# Patient Record
Sex: Female | Born: 1935 | Race: White | Hispanic: No | Marital: Married | Smoking: Never smoker
Health system: Southern US, Community
[De-identification: ages and names within clinical notes are randomized; demographics above are authoritative.]

## PROBLEM LIST (undated history)

## (undated) ENCOUNTER — Ambulatory Visit: Payer: Medicare Other

## (undated) DIAGNOSIS — B019 Varicella without complication: Secondary | ICD-10-CM

## (undated) DIAGNOSIS — R55 Syncope and collapse: Secondary | ICD-10-CM

## (undated) DIAGNOSIS — G459 Transient cerebral ischemic attack, unspecified: Secondary | ICD-10-CM

## (undated) DIAGNOSIS — S92909A Unspecified fracture of unspecified foot, initial encounter for closed fracture: Secondary | ICD-10-CM

## (undated) DIAGNOSIS — F0280 Dementia in other diseases classified elsewhere without behavioral disturbance: Secondary | ICD-10-CM

## (undated) DIAGNOSIS — N390 Urinary tract infection, site not specified: Secondary | ICD-10-CM

## (undated) HISTORY — DX: Syncope and collapse: R55

## (undated) HISTORY — DX: Urinary tract infection, site not specified: N39.0

## (undated) HISTORY — PX: APPENDECTOMY: SHX54

## (undated) HISTORY — DX: Varicella without complication: B01.9

## (undated) HISTORY — DX: Unspecified fracture of unspecified foot, initial encounter for closed fracture: S92.909A

## (undated) HISTORY — PX: ABDOMINAL HYSTERECTOMY: SHX81

## (undated) HISTORY — DX: Transient cerebral ischemic attack, unspecified: G45.9

---

## 2008-12-24 HISTORY — PX: PACEMAKER INSERTION: SHX728

## 2015-08-13 DIAGNOSIS — S92909A Unspecified fracture of unspecified foot, initial encounter for closed fracture: Secondary | ICD-10-CM

## 2015-08-13 HISTORY — DX: Unspecified fracture of unspecified foot, initial encounter for closed fracture: S92.909A

## 2016-05-13 ENCOUNTER — Encounter: Payer: Self-pay | Admitting: Family Medicine

## 2016-05-17 ENCOUNTER — Encounter: Payer: Self-pay | Admitting: Family Medicine

## 2016-08-15 DIAGNOSIS — L570 Actinic keratosis: Secondary | ICD-10-CM | POA: Diagnosis not present

## 2016-11-09 DIAGNOSIS — Z950 Presence of cardiac pacemaker: Secondary | ICD-10-CM | POA: Diagnosis not present

## 2016-11-14 DIAGNOSIS — T82519A Breakdown (mechanical) of unspecified cardiac and vascular devices and implants, initial encounter: Secondary | ICD-10-CM | POA: Diagnosis not present

## 2016-11-14 DIAGNOSIS — Z950 Presence of cardiac pacemaker: Secondary | ICD-10-CM | POA: Diagnosis not present

## 2016-11-14 DIAGNOSIS — I495 Sick sinus syndrome: Secondary | ICD-10-CM | POA: Diagnosis not present

## 2016-11-14 DIAGNOSIS — Z45010 Encounter for checking and testing of cardiac pacemaker pulse generator [battery]: Secondary | ICD-10-CM | POA: Diagnosis not present

## 2016-11-14 DIAGNOSIS — T82111A Breakdown (mechanical) of cardiac pulse generator (battery), initial encounter: Secondary | ICD-10-CM | POA: Diagnosis not present

## 2016-11-15 DIAGNOSIS — R9431 Abnormal electrocardiogram [ECG] [EKG]: Secondary | ICD-10-CM | POA: Diagnosis not present

## 2016-11-15 DIAGNOSIS — Z950 Presence of cardiac pacemaker: Secondary | ICD-10-CM | POA: Diagnosis not present

## 2016-11-25 DIAGNOSIS — I340 Nonrheumatic mitral (valve) insufficiency: Secondary | ICD-10-CM | POA: Diagnosis not present

## 2016-11-25 DIAGNOSIS — Z45010 Encounter for checking and testing of cardiac pacemaker pulse generator [battery]: Secondary | ICD-10-CM | POA: Diagnosis not present

## 2016-11-25 DIAGNOSIS — I361 Nonrheumatic tricuspid (valve) insufficiency: Secondary | ICD-10-CM | POA: Diagnosis not present

## 2016-11-25 DIAGNOSIS — E785 Hyperlipidemia, unspecified: Secondary | ICD-10-CM | POA: Diagnosis not present

## 2016-11-25 DIAGNOSIS — T82111A Breakdown (mechanical) of cardiac pulse generator (battery), initial encounter: Secondary | ICD-10-CM | POA: Diagnosis not present

## 2016-11-25 DIAGNOSIS — I495 Sick sinus syndrome: Secondary | ICD-10-CM | POA: Diagnosis not present

## 2016-11-25 DIAGNOSIS — Z7901 Long term (current) use of anticoagulants: Secondary | ICD-10-CM | POA: Diagnosis not present

## 2016-11-25 DIAGNOSIS — I10 Essential (primary) hypertension: Secondary | ICD-10-CM | POA: Diagnosis not present

## 2016-11-25 DIAGNOSIS — Z8673 Personal history of transient ischemic attack (TIA), and cerebral infarction without residual deficits: Secondary | ICD-10-CM | POA: Diagnosis not present

## 2016-11-25 HISTORY — PX: PACEMAKER GENERATOR CHANGE: SHX5998

## 2016-12-31 DIAGNOSIS — Z1283 Encounter for screening for malignant neoplasm of skin: Secondary | ICD-10-CM | POA: Diagnosis not present

## 2016-12-31 DIAGNOSIS — Z4802 Encounter for removal of sutures: Secondary | ICD-10-CM | POA: Diagnosis not present

## 2016-12-31 DIAGNOSIS — L814 Other melanin hyperpigmentation: Secondary | ICD-10-CM | POA: Diagnosis not present

## 2016-12-31 DIAGNOSIS — L821 Other seborrheic keratosis: Secondary | ICD-10-CM | POA: Diagnosis not present

## 2016-12-31 DIAGNOSIS — L820 Inflamed seborrheic keratosis: Secondary | ICD-10-CM | POA: Diagnosis not present

## 2016-12-31 DIAGNOSIS — D225 Melanocytic nevi of trunk: Secondary | ICD-10-CM | POA: Diagnosis not present

## 2016-12-31 DIAGNOSIS — Z872 Personal history of diseases of the skin and subcutaneous tissue: Secondary | ICD-10-CM | POA: Diagnosis not present

## 2017-01-16 DIAGNOSIS — H8110 Benign paroxysmal vertigo, unspecified ear: Secondary | ICD-10-CM | POA: Diagnosis not present

## 2017-01-16 DIAGNOSIS — Z6822 Body mass index (BMI) 22.0-22.9, adult: Secondary | ICD-10-CM | POA: Diagnosis not present

## 2017-01-21 DIAGNOSIS — H8110 Benign paroxysmal vertigo, unspecified ear: Secondary | ICD-10-CM | POA: Diagnosis not present

## 2017-05-10 DIAGNOSIS — Z950 Presence of cardiac pacemaker: Secondary | ICD-10-CM | POA: Diagnosis not present

## 2017-05-21 DIAGNOSIS — I5041 Acute combined systolic (congestive) and diastolic (congestive) heart failure: Secondary | ICD-10-CM | POA: Diagnosis not present

## 2017-05-21 DIAGNOSIS — I059 Rheumatic mitral valve disease, unspecified: Secondary | ICD-10-CM | POA: Diagnosis not present

## 2017-05-21 DIAGNOSIS — I351 Nonrheumatic aortic (valve) insufficiency: Secondary | ICD-10-CM | POA: Diagnosis not present

## 2017-05-21 DIAGNOSIS — I369 Nonrheumatic tricuspid valve disorder, unspecified: Secondary | ICD-10-CM | POA: Diagnosis not present

## 2017-05-21 DIAGNOSIS — Q211 Atrial septal defect: Secondary | ICD-10-CM | POA: Diagnosis not present

## 2017-05-21 DIAGNOSIS — R079 Chest pain, unspecified: Secondary | ICD-10-CM | POA: Diagnosis not present

## 2017-05-21 DIAGNOSIS — I10 Essential (primary) hypertension: Secondary | ICD-10-CM | POA: Diagnosis not present

## 2017-05-21 DIAGNOSIS — Z950 Presence of cardiac pacemaker: Secondary | ICD-10-CM | POA: Diagnosis not present

## 2017-05-21 DIAGNOSIS — Z8673 Personal history of transient ischemic attack (TIA), and cerebral infarction without residual deficits: Secondary | ICD-10-CM | POA: Diagnosis not present

## 2017-05-21 DIAGNOSIS — I4589 Other specified conduction disorders: Secondary | ICD-10-CM | POA: Diagnosis not present

## 2017-05-21 DIAGNOSIS — E782 Mixed hyperlipidemia: Secondary | ICD-10-CM | POA: Diagnosis not present

## 2017-06-06 DIAGNOSIS — I10 Essential (primary) hypertension: Secondary | ICD-10-CM | POA: Diagnosis not present

## 2017-06-06 DIAGNOSIS — Z8673 Personal history of transient ischemic attack (TIA), and cerebral infarction without residual deficits: Secondary | ICD-10-CM | POA: Diagnosis not present

## 2017-06-06 DIAGNOSIS — E785 Hyperlipidemia, unspecified: Secondary | ICD-10-CM | POA: Diagnosis not present

## 2017-06-11 DIAGNOSIS — M899 Disorder of bone, unspecified: Secondary | ICD-10-CM | POA: Diagnosis not present

## 2017-06-11 DIAGNOSIS — Z0000 Encounter for general adult medical examination without abnormal findings: Secondary | ICD-10-CM | POA: Diagnosis not present

## 2017-06-11 DIAGNOSIS — I10 Essential (primary) hypertension: Secondary | ICD-10-CM | POA: Diagnosis not present

## 2017-06-11 DIAGNOSIS — Z23 Encounter for immunization: Secondary | ICD-10-CM | POA: Diagnosis not present

## 2017-06-11 DIAGNOSIS — R7303 Prediabetes: Secondary | ICD-10-CM | POA: Diagnosis not present

## 2017-06-11 DIAGNOSIS — I679 Cerebrovascular disease, unspecified: Secondary | ICD-10-CM | POA: Diagnosis not present

## 2017-06-11 DIAGNOSIS — Z6821 Body mass index (BMI) 21.0-21.9, adult: Secondary | ICD-10-CM | POA: Diagnosis not present

## 2017-06-11 DIAGNOSIS — R7301 Impaired fasting glucose: Secondary | ICD-10-CM | POA: Diagnosis not present

## 2017-06-11 DIAGNOSIS — M949 Disorder of cartilage, unspecified: Secondary | ICD-10-CM | POA: Diagnosis not present

## 2017-06-11 DIAGNOSIS — E785 Hyperlipidemia, unspecified: Secondary | ICD-10-CM | POA: Diagnosis not present

## 2017-08-11 DIAGNOSIS — Z950 Presence of cardiac pacemaker: Secondary | ICD-10-CM | POA: Diagnosis not present

## 2017-08-29 ENCOUNTER — Encounter: Payer: Self-pay | Admitting: Family Medicine

## 2017-08-29 DIAGNOSIS — Z1231 Encounter for screening mammogram for malignant neoplasm of breast: Secondary | ICD-10-CM | POA: Diagnosis not present

## 2017-10-21 DIAGNOSIS — B372 Candidiasis of skin and nail: Secondary | ICD-10-CM | POA: Diagnosis not present

## 2017-11-06 ENCOUNTER — Encounter: Payer: Self-pay | Admitting: Family Medicine

## 2017-11-06 DIAGNOSIS — R922 Inconclusive mammogram: Secondary | ICD-10-CM | POA: Diagnosis not present

## 2017-11-06 DIAGNOSIS — R928 Other abnormal and inconclusive findings on diagnostic imaging of breast: Secondary | ICD-10-CM | POA: Diagnosis not present

## 2017-11-10 DIAGNOSIS — Z950 Presence of cardiac pacemaker: Secondary | ICD-10-CM | POA: Diagnosis not present

## 2017-11-24 ENCOUNTER — Encounter: Payer: Self-pay | Admitting: Family Medicine

## 2017-11-24 DIAGNOSIS — I429 Cardiomyopathy, unspecified: Secondary | ICD-10-CM | POA: Diagnosis not present

## 2017-11-24 DIAGNOSIS — I472 Ventricular tachycardia: Secondary | ICD-10-CM | POA: Diagnosis not present

## 2017-11-24 DIAGNOSIS — Z950 Presence of cardiac pacemaker: Secondary | ICD-10-CM | POA: Diagnosis not present

## 2017-11-24 DIAGNOSIS — R55 Syncope and collapse: Secondary | ICD-10-CM | POA: Diagnosis not present

## 2017-11-24 DIAGNOSIS — I471 Supraventricular tachycardia: Secondary | ICD-10-CM | POA: Diagnosis not present

## 2017-11-24 DIAGNOSIS — I495 Sick sinus syndrome: Secondary | ICD-10-CM | POA: Diagnosis not present

## 2017-11-24 DIAGNOSIS — I340 Nonrheumatic mitral (valve) insufficiency: Secondary | ICD-10-CM | POA: Diagnosis not present

## 2017-11-24 DIAGNOSIS — I351 Nonrheumatic aortic (valve) insufficiency: Secondary | ICD-10-CM | POA: Diagnosis not present

## 2017-11-24 DIAGNOSIS — I10 Essential (primary) hypertension: Secondary | ICD-10-CM | POA: Diagnosis not present

## 2017-12-01 ENCOUNTER — Encounter: Payer: Self-pay | Admitting: Family Medicine

## 2017-12-01 DIAGNOSIS — I471 Supraventricular tachycardia: Secondary | ICD-10-CM | POA: Diagnosis not present

## 2017-12-01 DIAGNOSIS — R55 Syncope and collapse: Secondary | ICD-10-CM | POA: Diagnosis not present

## 2017-12-01 DIAGNOSIS — I472 Ventricular tachycardia: Secondary | ICD-10-CM | POA: Diagnosis not present

## 2017-12-01 DIAGNOSIS — I495 Sick sinus syndrome: Secondary | ICD-10-CM | POA: Diagnosis not present

## 2017-12-01 DIAGNOSIS — Z950 Presence of cardiac pacemaker: Secondary | ICD-10-CM | POA: Diagnosis not present

## 2017-12-01 DIAGNOSIS — I10 Essential (primary) hypertension: Secondary | ICD-10-CM | POA: Diagnosis not present

## 2018-01-28 ENCOUNTER — Encounter: Payer: Self-pay | Admitting: Family Medicine

## 2018-01-28 ENCOUNTER — Ambulatory Visit (INDEPENDENT_AMBULATORY_CARE_PROVIDER_SITE_OTHER): Payer: Medicare Other | Admitting: Family Medicine

## 2018-01-28 VITALS — BP 122/70 | HR 62 | Temp 97.5°F | Ht 64.0 in | Wt 119.0 lb

## 2018-01-28 DIAGNOSIS — G459 Transient cerebral ischemic attack, unspecified: Secondary | ICD-10-CM

## 2018-01-28 DIAGNOSIS — F4321 Adjustment disorder with depressed mood: Secondary | ICD-10-CM | POA: Insufficient documentation

## 2018-01-28 DIAGNOSIS — R413 Other amnesia: Secondary | ICD-10-CM | POA: Diagnosis not present

## 2018-01-28 DIAGNOSIS — Z8673 Personal history of transient ischemic attack (TIA), and cerebral infarction without residual deficits: Secondary | ICD-10-CM | POA: Insufficient documentation

## 2018-01-28 LAB — TSH: TSH: 2.1 u[IU]/mL (ref 0.35–4.50)

## 2018-01-28 LAB — CBC
HEMATOCRIT: 40.6 % (ref 36.0–46.0)
Hemoglobin: 13.7 g/dL (ref 12.0–15.0)
MCHC: 33.7 g/dL (ref 30.0–36.0)
MCV: 97 fl (ref 78.0–100.0)
Platelets: 207 10*3/uL (ref 150.0–400.0)
RBC: 4.18 Mil/uL (ref 3.87–5.11)
RDW: 14 % (ref 11.5–15.5)
WBC: 4.6 10*3/uL (ref 4.0–10.5)

## 2018-01-28 LAB — BASIC METABOLIC PANEL
BUN: 18 mg/dL (ref 6–23)
CALCIUM: 9.3 mg/dL (ref 8.4–10.5)
CO2: 30 meq/L (ref 19–32)
Chloride: 100 mEq/L (ref 96–112)
Creatinine, Ser: 0.93 mg/dL (ref 0.40–1.20)
GFR: 61.7 mL/min (ref >60.00)
GLUCOSE: 98 mg/dL (ref 70–99)
Potassium: 4.1 mEq/L (ref 3.5–5.1)
Sodium: 137 mEq/L (ref 135–145)

## 2018-01-28 LAB — VITAMIN B12: Vitamin B-12: 424 pg/mL (ref 211–911)

## 2018-01-28 MED ORDER — ESCITALOPRAM OXALATE 5 MG PO TABS: 5.0000 mg | ORAL_TABLET | Freq: Every day | ORAL | 1 refills | Status: DC

## 2018-01-28 NOTE — Progress Notes (Signed)
Sara Perkins - 82 y.o. female MRN [id number]  Date of birth: September 17, 1935  Subjective Chief Complaint  Patient presents with  . Establish Care    est care.-Memory issues    HPI  Sara Perkins is a 82 y.o. female with a history of TIA here today to establish care with new pcp.  She is accompanied by her son who is concerned about some  Memory changes.  She unfortunately lost her husband earlier this year and moved down to [place] from [place] to be closer to family here.  She is living in an assisted living facility at this time.  Son reports that he has noticed some issues with short term memory since she has moved down here.  She is forgetful about recent events but does not have any issues with names or faces.  She does not have any problems with ADL's.   She is able to manage her own medications and money at this time.  She does not drive.  She does endorse some depressive symptoms since the death of her husband and with the move away from her home.  She denies SI or significant anxiety.  She does sleep fairly well and has a normal appetite.  There is no known family history of alzheimers disease or parkinson's and she denies any movement disorder or tremor.   Depression screen [facility] 2/9 01/28/2018 01/28/2018  Decreased Interest 2 2  Down, Depressed, Hopeless 2 2  PHQ - 2 Score 4 4  Altered sleeping 1 -  Tired, decreased energy 1 -  Change in appetite 0 -  Feeling bad or failure about yourself  0 -  Trouble concentrating 1 -  Moving slowly or fidgety/restless 0 -  Suicidal thoughts 0 -  PHQ-9 Score 7 -   GAD 7 : Generalized Anxiety Score 01/28/2018  Nervous, Anxious, on Edge 0  Control/stop worrying 0  Worry too much - different things 1  Trouble relaxing 1  Restless 0  Easily annoyed or irritable 1  Afraid - awful might happen 0  Total GAD 7 Score 3     No Known Allergies  Past Medical History:  Diagnosis Date  . Broken foot 2017  . Chicken pox   . Syncope    Related to BP  medication   . TIA (transient ischemic attack)   . UTI (urinary tract infection)     Past Surgical History:  Procedure Laterality Date  . ABDOMINAL HYSTERECTOMY    . APPENDECTOMY    . PACEMAKER INSERTION  2010  . pacemaker replace  2018  . TIA  2008  . TIA  2010    Social History   Socioeconomic History  . Marital status: Unknown    Spouse name: Not on file  . Number of children: Not on file  . Years of education: Not on file  . Highest education level: Not on file  Occupational History  . Not on file  Social Needs  . Financial resource strain: Not on file  . Food insecurity:    Worry: Not on file    Inability: Not on file  . Transportation needs:    Medical: Not on file    Non-medical: Not on file  Tobacco Use  . Smoking status: Not on file  Substance and Sexual Activity  . Alcohol use: Not on file  . Drug use: Not on file  . Sexual activity: Not on file  Lifestyle  . Physical activity:    Days per week: Not  on file    Minutes per session: Not on file  . Stress: Not on file  Relationships  . Social connections:    Talks on phone: Not on file    Gets together: Not on file    Attends religious service: Not on file    Active member of club or organization: Not on file    Attends meetings of clubs or organizations: Not on file    Relationship status: Not on file  Other Topics Concern  . Not on file  Social History Narrative  . Not on file    History reviewed. No pertinent family history.  Health Maintenance  Topic Date Due  . TETANUS/TDAP  05/24/1957  . DEXA SCAN  05/25/2003  . PNA vac Low Risk Adult (1 of 2 - PCV13) 05/25/2003  . INFLUENZA VACCINE  03/12/2018    ----------------------------------------------------------------------------------------------------------------------------------------------------------------------------------------------------------------- Physical Exam BP 122/70 (BP Location: Left Arm, Patient Position: Sitting, Cuff  Size: Normal)   Pulse 62   Temp (!) 97.5 F (36.4 C) (Oral)   Ht 5\' 4"  (1.626 m)   Wt 119 lb (54 kg)   SpO2 99%   BMI 20.43 kg/m   Physical Exam  Constitutional: She is oriented to person, place, and time. She appears well-nourished. No distress.  HENT:  Head: Normocephalic and atraumatic.  Mouth/Throat: Oropharynx is clear and moist.  Eyes: Pupils are equal, round, and reactive to light. EOM are normal. No scleral icterus.  Neck: Neck supple. No thyromegaly present.  Cardiovascular: Normal rate, regular rhythm and normal heart sounds.  Pulmonary/Chest: Effort normal and breath sounds normal.  Musculoskeletal: She exhibits no edema or tenderness.  Lymphadenopathy:    She has no cervical adenopathy.  Neurological: She is alert and oriented to person, place, and time. No cranial nerve deficit. Coordination normal.  Ambulates and mounts/dismounts exam table without difficulty.   Skin: Skin is warm and dry. No rash noted.  Psychiatric: She has a normal mood and affect. Her behavior is normal. Judgment and thought content normal.    ------------------------------------------------------------------------------------------------------------------------------------------------------------------------------------------------------------------- Assessment and Plan  Memory change Will treat for depression.  Check TSH, T15 and metabolic panel today Will review at next follow up in 2-3 weeks, will complete MOCA at that time as well if labs normal.    Adjustment disorder with depressed mood PHQ indicates mild depressive symptoms, mostly related to loss of her husband.  Discussed that often memory changes can be related to depressive symptoms. Discussed treatment options including psychotherapy or medication.  Declines therapy at this time but would like to try medication.  Will start lexapro 5mg .    TIA (transient ischemic attack) History of recurrent TIA, recommend continuation of  Plavix and statin.     Return in about 3 weeks (around 02/18/2018) for Depression/memory change.

## 2018-01-28 NOTE — Assessment & Plan Note (Signed)
History of recurrent TIA, recommend continuation of Plavix and statin.

## 2018-01-28 NOTE — Assessment & Plan Note (Signed)
Will treat for depression.  Check TSH, P03 and metabolic panel today Will review at next follow up in 2-3 weeks, will complete MOCA at that time as well if labs normal.

## 2018-01-28 NOTE — Patient Instructions (Signed)

## 2018-01-28 NOTE — Assessment & Plan Note (Signed)
PHQ indicates mild depressive symptoms, mostly related to loss of her husband.  Discussed that often memory changes can be related to depressive symptoms. Discussed treatment options including psychotherapy or medication.  Declines therapy at this time but would like to try medication.  Will start lexapro 5mg .

## 2018-02-09 DIAGNOSIS — Z950 Presence of cardiac pacemaker: Secondary | ICD-10-CM | POA: Diagnosis not present

## 2018-02-23 ENCOUNTER — Ambulatory Visit (INDEPENDENT_AMBULATORY_CARE_PROVIDER_SITE_OTHER): Payer: Medicare Other | Admitting: Family Medicine

## 2018-02-23 ENCOUNTER — Encounter: Payer: Self-pay | Admitting: Family Medicine

## 2018-02-23 VITALS — BP 124/70 | HR 65 | Temp 98.1°F | Ht 63.0 in | Wt 121.4 lb

## 2018-02-23 DIAGNOSIS — F4321 Adjustment disorder with depressed mood: Secondary | ICD-10-CM

## 2018-02-23 DIAGNOSIS — R413 Other amnesia: Secondary | ICD-10-CM | POA: Diagnosis not present

## 2018-02-23 NOTE — Patient Instructions (Signed)

## 2018-02-23 NOTE — Assessment & Plan Note (Signed)
Depressive symptoms have improved some but still grieving loss of her husband.  She does not want to take medication at this time.

## 2018-02-23 NOTE — Progress Notes (Signed)
Sara Perkins - 82 y.o. female MRN [id number]  Date of birth: 1935-11-19  Subjective Chief Complaint  Patient presents with  . Follow-up    HPI  Sara Perkins is 82 y.o. female with a history of TIA here today for follow up of memory loss.  Memory issues still the same since visit on 6/19, no interval change.  Has some continued depressive symptoms related to recent loss of her husband.  She did not try lexapro as she was concerned about potential side effects from medication but depression symptoms seem to have improved some.  She recently took a trip to [place] with her other son which she felt was good for her as this is where she is originally from and got to visit with family.  She is still able to complete ADL's however son is concerned about her forgetfulness and that she has some bruises on extremities that she can't explain where they came from. She denies known trauma.   She continues to live at an assisted living facility.   Depression screen [facility] 2/9 02/23/2018 01/28/2018 01/28/2018  Decreased Interest 1 2 2   Down, Depressed, Hopeless 1 2 2   PHQ - 2 Score 2 4 4   Altered sleeping 0 1 -  Tired, decreased energy 0 1 -  Change in appetite 0 0 -  Feeling bad or failure about yourself  0 0 -  Trouble concentrating 0 1 -  Moving slowly or fidgety/restless 0 0 -  Suicidal thoughts 0 0 -  PHQ-9 Score 2 7 -   GAD 7 : Generalized Anxiety Score 02/23/2018 01/28/2018  Nervous, Anxious, on Edge 1 0  Control/stop worrying 1 0  Worry too much - different things 1 1  Trouble relaxing 1 1  Restless 1 0  Easily annoyed or irritable 1 1  Afraid - awful might happen 0 0  Total GAD 7 Score 6 3    ROS:  ROS completed and negative except as noted per HPI  No Known Allergies  Past Medical History:  Diagnosis Date  . Broken foot 2017  . Chicken pox   . Syncope    Related to BP medication   . TIA (transient ischemic attack)   . UTI (urinary tract infection)     Past Surgical History:    Procedure Laterality Date  . ABDOMINAL HYSTERECTOMY    . APPENDECTOMY    . PACEMAKER INSERTION  2010  . pacemaker replace  2018  . TIA  2008  . TIA  2010    Social History   Socioeconomic History  . Marital status: Unknown    Spouse name: Not on file  . Number of children: Not on file  . Years of education: Not on file  . Highest education level: Not on file  Occupational History  . Not on file  Social Needs  . Financial resource strain: Not on file  . Food insecurity:    Worry: Not on file    Inability: Not on file  . Transportation needs:    Medical: Not on file    Non-medical: Not on file  Tobacco Use  . Smoking status: Never Smoker  . Smokeless tobacco: Never Used  Substance and Sexual Activity  . Alcohol use: Not on file  . Drug use: Not on file  . Sexual activity: Not on file  Lifestyle  . Physical activity:    Days per week: Not on file    Minutes per session: Not on file  . Stress:  Not on file  Relationships  . Social connections:    Talks on phone: Not on file    Gets together: Not on file    Attends religious service: Not on file    Active member of club or organization: Not on file    Attends meetings of clubs or organizations: Not on file    Relationship status: Not on file  Other Topics Concern  . Not on file  Social History Narrative  . Not on file    No family history on file.  Health Maintenance  Topic Date Due  . TETANUS/TDAP  05/24/1957  . DEXA SCAN  05/25/2003  . PNA vac Low Risk Adult (1 of 2 - PCV13) 05/25/2003  . INFLUENZA VACCINE  03/12/2018    ----------------------------------------------------------------------------------------------------------------------------------------------------------------------------------------------------------------- Physical Exam BP 124/70 (BP Location: Left Arm, Patient Position: Sitting, Cuff Size: Normal)   Pulse 65   Temp 98.1 F (36.7 C) (Oral)   Ht 5\' 3"  (1.6 m)   Wt 121 lb 6.4 oz  (55.1 kg)   SpO2 96%   BMI 21.51 kg/m   Physical Exam  Constitutional: She appears well-nourished. No distress.  HENT:  Head: Normocephalic and atraumatic.  Mouth/Throat: Oropharynx is clear and moist.  Eyes: No scleral icterus.  Cardiovascular: Normal rate and regular rhythm.  Pulmonary/Chest: Effort normal and breath sounds normal.  Neurological: She is alert. No cranial nerve deficit. Coordination normal.  Skin: Skin is warm and dry.  Psychiatric: She has a normal mood and affect. Her behavior is normal. Judgment and thought content normal.    ------------------------------------------------------------------------------------------------------------------------------------------------------------------------------------------------------------------- Assessment and Plan  Memory change Cognitive assessment completed using standardized tool ( montreal cognitive assessment).  Her score of 22 shows that she has some mild to moderate cognitive impairment.  Points were lost in areas of delayed recall and orientation.  I reviewed these results with her and her son today.  This indicates a strong likelihood of early dementia.  Most likely cause is vascular vs alzheimers type.  She does have some depressive symptoms as well which are likely contributing.  She declines medication at this time and I will follow her up in 2 months.  >31 minutes spent administering, interpreting and reviewing Montreal cognitive assessment and worksheet sent to be scanned.   Adjustment disorder with depressed mood Depressive symptoms have improved some but still grieving loss of her husband.  She does not want to take medication at this time.

## 2018-02-23 NOTE — Assessment & Plan Note (Addendum)
Cognitive assessment completed using standardized tool ( montreal cognitive assessment).  Her score of 22 shows that she has some mild to moderate cognitive impairment.  Points were lost in areas of delayed recall and orientation.  I reviewed these results with her and her son today.  This indicates a strong likelihood of early dementia.  Most likely cause is vascular vs alzheimers type.  She does have some depressive symptoms as well which are likely contributing.  She declines medication at this time and I will follow her up in 2 months.  >31 minutes spent administering, interpreting and reviewing Montreal cognitive assessment and worksheet sent to be scanned.

## 2018-04-27 ENCOUNTER — Ambulatory Visit (INDEPENDENT_AMBULATORY_CARE_PROVIDER_SITE_OTHER): Payer: Medicare Other | Admitting: Family Medicine

## 2018-04-27 ENCOUNTER — Encounter: Payer: Self-pay | Admitting: Family Medicine

## 2018-04-27 VITALS — BP 110/66 | HR 60 | Temp 98.1°F | Ht 63.0 in | Wt 123.2 lb

## 2018-04-27 DIAGNOSIS — R413 Other amnesia: Secondary | ICD-10-CM

## 2018-04-27 DIAGNOSIS — Z950 Presence of cardiac pacemaker: Secondary | ICD-10-CM

## 2018-04-27 NOTE — Assessment & Plan Note (Signed)
Stable at this time. Does not wish to pursue further work up with neurology or start medication at this time.  I will see her back in about 4 months or sooner as needed.

## 2018-04-27 NOTE — Progress Notes (Signed)
Sara Perkins - 82 y.o. female MRN [id number]  Date of birth: [birth year]  Subjective Chief Complaint  Patient presents with  . Follow-up    HPI Sara Perkins is a 82 y.o. female with history of syncope s/p pacemaker, tia and mild memory loss here today for follow up of memory loss.  She is accompanied by her son.  They report that since last appt her memory has been stable.  She still feels like the recent changes from moving and loss of her husband have contributed to some of this.  He son denies any further decline in memory at this time.  She does not wish to add any additional medication at this time and declined lexapro previously for prolonged grief and depression/anxiety.    Depression screen [facility] 2/9 04/27/2018 02/23/2018 01/28/2018  Decreased Interest 1 1 2   Down, Depressed, Hopeless 2 1 2   PHQ - 2 Score 3 2 4   Altered sleeping 0 0 1  Tired, decreased energy 1 0 1  Change in appetite 0 0 0  Feeling bad or failure about yourself  0 0 0  Trouble concentrating 0 0 1  Moving slowly or fidgety/restless 0 0 0  Suicidal thoughts 0 0 0  PHQ-9 Score 4 2 7    GAD 7 : Generalized Anxiety Score 04/27/2018 02/23/2018 01/28/2018  Nervous, Anxious, on Edge 0 1 0  Control/stop worrying 1 1 0  Worry too much - different things 1 1 1   Trouble relaxing 0 1 1  Restless 0 1 0  Easily annoyed or irritable 1 1 1   Afraid - awful might happen 0 0 0  Total GAD 7 Score 3 6 3       She is also requesting a referral to cardiology for monitoring of pacemaker.  Has [employer] generator with med[employer]onic leads.  Generator change completed in 2018.  Denies any symptoms at this time.  She is taking plavix for recurrent tia.    ROS:  A comprehensive ROS was completed and negative except as noted per HPI.  No Known Allergies  Past Medical History:  Diagnosis Date  . Broken foot 2017  . Chicken pox   . Syncope    Related to BP medication   . TIA (transient ischemic attack)   . UTI (urinary tract  infection)     Past Surgical History:  Procedure Laterality Date  . ABDOMINAL HYSTERECTOMY    . APPENDECTOMY    . PACEMAKER INSERTION  2010  . pacemaker replace  2018  . TIA  2008  . TIA  2010    Social History   Socioeconomic History  . Marital status: Unknown    Spouse name: Not on file  . Number of children: Not on file  . Years of education: Not on file  . Highest education level: Not on file  Occupational History  . Not on file  Social Needs  . Financial resource strain: Not on file  . Food insecurity:    Worry: Not on file    Inability: Not on file  . Transportation needs:    Medical: Not on file    Non-medical: Not on file  Tobacco Use  . Smoking status: Never Smoker  . Smokeless tobacco: Never Used  Substance and Sexual Activity  . Alcohol use: Not on file  . Drug use: Not on file  . Sexual activity: Not on file  Lifestyle  . Physical activity:    Days per week: Not on file  Minutes per session: Not on file  . Stress: Not on file  Relationships  . Social connections:    Talks on phone: Not on file    Gets together: Not on file    Attends religious service: Not on file    Active member of club or organization: Not on file    Attends meetings of clubs or organizations: Not on file    Relationship status: Not on file  Other Topics Concern  . Not on file  Social History Narrative  . Not on file    No family history on file.  Health Maintenance  Topic Date Due  . TETANUS/TDAP  05/24/1957  . DEXA SCAN  05/25/2003  . PNA vac Low Risk Adult (1 of 2 - PCV13) 05/25/2003  . INFLUENZA VACCINE  03/12/2018    ----------------------------------------------------------------------------------------------------------------------------------------------------------------------------------------------------------------- Physical Exam BP 110/66   Pulse 60   Temp 98.1 F (36.7 C)   Ht 5\' 3"  (1.6 m)   Wt 123 lb 3.2 oz (55.9 kg)   SpO2 97%   BMI 21.82  kg/m   Physical Exam  Constitutional: She is oriented to person, place, and time. She appears well-nourished. No distress.  HENT:  Head: Normocephalic and atraumatic.  Mouth/Throat: Oropharynx is clear and moist.  Eyes: No scleral icterus.  Neck: Neck supple.  Cardiovascular: Normal rate, regular rhythm and normal heart sounds.  Pulmonary/Chest: Effort normal and breath sounds normal.  Musculoskeletal: Normal range of motion. She exhibits no edema.  Neurological: She is alert and oriented to person, place, and time.  Skin: Skin is warm and dry. No rash noted.  Psychiatric: She has a normal mood and affect. Her behavior is normal.    ------------------------------------------------------------------------------------------------------------------------------------------------------------------------------------------------------------------- Assessment and Plan  Memory change Stable at this time. Does not wish to pursue further work up with neurology or start medication at this time.  I will see her back in about 4 months or sooner as needed.   Pacemaker Referral placed for cardiology

## 2018-04-27 NOTE — Assessment & Plan Note (Signed)
Referral placed for cardiology

## 2018-04-27 NOTE — Patient Instructions (Signed)
I have placed a referral for cardiology, they will contact you for an appointment.   I will see you back in 4-5 months.

## 2018-05-05 ENCOUNTER — Telehealth: Payer: Self-pay

## 2018-05-05 NOTE — Telephone Encounter (Signed)
Spoke with patient regarding referral and stated that the referral is still being worked on and someone will give a call to set up that appointment. Also provided patient with that office number

## 2018-05-05 NOTE — Telephone Encounter (Signed)
Copied from [place] [id number]. Topic: Referral - Status >> May 05, 2018 11:59 AM Yvette Rack wrote: Reason for CRM: Pt states she has not heard from anyone regarding referral for Cardiologist. Pt requests call back. Cb# [phone]

## 2018-05-11 DIAGNOSIS — Z950 Presence of cardiac pacemaker: Secondary | ICD-10-CM | POA: Diagnosis not present

## 2018-05-21 DIAGNOSIS — Z23 Encounter for immunization: Secondary | ICD-10-CM | POA: Diagnosis not present

## 2018-06-24 ENCOUNTER — Other Ambulatory Visit: Payer: Self-pay | Admitting: Emergency Medicine

## 2018-06-24 MED ORDER — CLOPIDOGREL BISULFATE 75 MG PO TABS: 75.0000 mg | ORAL_TABLET | Freq: Every day | ORAL | 1 refills | Status: DC

## 2018-07-31 ENCOUNTER — Ambulatory Visit: Payer: Medicare Other | Admitting: Cardiology

## 2018-08-10 DIAGNOSIS — Z950 Presence of cardiac pacemaker: Secondary | ICD-10-CM | POA: Diagnosis not present

## 2018-08-18 ENCOUNTER — Encounter: Payer: Self-pay | Admitting: Internal Medicine

## 2018-08-19 ENCOUNTER — Ambulatory Visit (INDEPENDENT_AMBULATORY_CARE_PROVIDER_SITE_OTHER): Payer: Medicare Other | Admitting: Internal Medicine

## 2018-08-19 ENCOUNTER — Encounter: Payer: Self-pay | Admitting: Internal Medicine

## 2018-08-19 VITALS — BP 130/70 | HR 61 | Ht 64.0 in | Wt 125.0 lb

## 2018-08-19 DIAGNOSIS — I495 Sick sinus syndrome: Secondary | ICD-10-CM | POA: Diagnosis not present

## 2018-08-19 DIAGNOSIS — Z950 Presence of cardiac pacemaker: Secondary | ICD-10-CM

## 2018-08-19 NOTE — Patient Instructions (Addendum)
Medication Instructions:  Your physician recommends that you continue on your current medications as directed. Please refer to the Current Medication list given to you today.  *If you need a refill on your cardiac medications before your next appointment, please call your pharmacy*  Labwork: None ordered  Testing/Procedures: None ordered  Follow-Up: Remote monitoring is used to monitor your Pacemaker or ICD from home. This monitoring reduces the number of office visits required to check your device to one time per year. It allows [place] to keep an eye on the functioning of your device to ensure it is working properly. You are scheduled for a device check from home on 11/18/2018. You may send your transmission at any time that day. If you have a wireless device, the transmission will be sent automatically. After your physician reviews your transmission, you will receive a postcard with your next transmission date.  Your physician wants you to follow-up in: 1 year with Tommye Standard, PA.  You will receive a reminder letter in the mail two months in advance. If you don't receive a letter, please call our office to schedule the follow-up appointment.  Thank you for choosing [facility]!!

## 2018-08-19 NOTE — Progress Notes (Signed)
Luetta Nutting, DO: Primary Cardiologist:  Sara Perkins is a 83 y.o. female with a h/o sinus bradycardia sp PPM (MDT) in [place] who presents today to establish care in the Electrophysiology device clinic.  Her husband died this past year and she has moved to [facility] to be near her son. The patient reports doing very well since having a pacemaker implanted and remains very active despite her age.  She had syncope prior to PPM implantation.  She did well for 8 years.  She then had recurrent syncope which was felt to be due to antihypertensive medications.  With adjustment, she has had no further problems.  Today, she  denies symptoms of palpitations, chest pain, shortness of breath, orthopnea, PND, lower extremity edema, dizziness, presyncope, syncope, or neurologic sequela.  The patientis tolerating medications without difficulties and is otherwise without complaint today.   Past Medical History:  Diagnosis Date  . Broken foot 2017  . Chicken pox   . Syncope    Related to BP medication   . TIA (transient ischemic attack)   . UTI (urinary tract infection)    Past Surgical History:  Procedure Laterality Date  . ABDOMINAL HYSTERECTOMY    . APPENDECTOMY    . PACEMAKER GENERATOR CHANGE  11/25/2016   [employer] L 101 generator change performed in [place] in [place]  . PACEMAKER INSERTION  12/24/2008   MDT PPM implanted by Dr Joeseph Amor in [place] History   Socioeconomic History  . Marital status: Unknown    Spouse name: Not on file  . Number of children: Not on file  . Years of education: Not on file  . Highest education level: Not on file  Occupational History  . Not on file  Social Needs  . Financial resource strain: Not on file  . Food insecurity:    Worry: Not on file    Inability: Not on file  . Transportation needs:    Medical: Not on file    Non-medical: Not on file  Tobacco Use  . Smoking status: Never Smoker  . Smokeless tobacco: Never Used    Substance and Sexual Activity  . Alcohol use: Never    Frequency: Never  . Drug use: Never  . Sexual activity: Not on file  Lifestyle  . Physical activity:    Days per week: Not on file    Minutes per session: Not on file  . Stress: Not on file  Relationships  . Social connections:    Talks on phone: Not on file    Gets together: Not on file    Attends religious service: Not on file    Active member of club or organization: Not on file    Attends meetings of clubs or organizations: Not on file    Relationship status: Not on file  . Intimate partner violence:    Fear of current or ex partner: Not on file    Emotionally abused: Not on file    Physically abused: Not on file    Forced sexual activity: Not on file  Other Topics Concern  . Not on file  Social History Narrative   Lives in [place] with son   Widowed   Retired from Scientist, research (life sciences) estate    History reviewed. No pertinent family history.  No Known Allergies  Current Outpatient Medications  Medication Sig Dispense Refill  . atorvastatin (LIPITOR) 10 MG tablet Take 10 mg by mouth daily.    . clopidogrel (  PLAVIX) 75 MG tablet Take 1 tablet (75 mg total) by mouth daily. 90 tablet 1  . furosemide (LASIX) 40 MG tablet Take 40 mg by mouth.    . Glucosamine-Chondroit-Vit C-Mn (GLUCOSAMINE 1500 COMPLEX PO) Take by mouth.    . potassium chloride SA (K-DUR,KLOR-CON) 20 MEQ tablet     . VITAMIN D PO Take 600 mg by mouth daily.     No current facility-administered medications for this visit.     ROS- all systems are reviewed and negative except as per HPI  Physical Exam: Vitals:   08/19/18 1451  BP: 130/70  Pulse: 61  SpO2: 95%  Weight: 125 lb (56.7 kg)  Height: 5\' 4"  (1.626 m)    GEN- The patient is well appearing, alert and oriented x 3 today.   Head- normocephalic, atraumatic Eyes-  Sclera clear, conjunctiva pink Ears- hearing intact Oropharynx- clear Neck- supple, no JVP Lymph- no cervical  lymphadenopathy Lungs- Clear to ausculation bilaterally, normal work of breathing Chest- pacemaker pocket is well healed Heart- Regular rate and rhythm, no murmurs, rubs or gallops, PMI not laterally displaced GI- soft, NT, ND, + BS Extremities- no clubbing, cyanosis, or edema MS- no significant deformity or atrophy Skin- no rash or lesion Psych- euthymic mood, full affect Neuro- strength and sensation are intact  Pacemaker interrogation- reviewed in detail today,  See PACEART report  Assessment and Plan:  1. Sick sinus syndrome with prior syncope Normal pacemaker function See Pace Art report No changes today  2. Prior TIA No sustained arrhythmias by device interrogation  lattitude Return to see EP [place] in a year  Thompson Grayer MD, [facility] 08/19/2018 3:34 PM

## 2018-08-31 ENCOUNTER — Telehealth: Payer: Self-pay | Admitting: Internal Medicine

## 2018-08-31 ENCOUNTER — Other Ambulatory Visit: Payer: Self-pay | Admitting: Family Medicine

## 2018-08-31 MED ORDER — ATORVASTATIN CALCIUM 10 MG PO TABS: 10.0000 mg | ORAL_TABLET | Freq: Every day | ORAL | 0 refills | Status: DC

## 2018-08-31 NOTE — Telephone Encounter (Signed)
 *  STAT* If patient is at the pharmacy, call can be transferred to refill team.   1. Which medications need to be refilled? (please list name of each medication and dose if known) atorvastatin (LIPITOR) 10 MG tablet  2. Which pharmacy/location (including street and city if local pharmacy) is medication to be sent to? [facility]  3. Do they need a 30 day or 90 day supply? [place]

## 2018-08-31 NOTE — Telephone Encounter (Signed)
Requested medication (s) are due for refill today: yes  Requested medication (s) are on the active medication list: yes  Last refill:  Last filled by historical provider  Future visit scheduled: No  Notes to clinic:  Unable to refill per protocol. Medication last filled by historical provider.     Requested Prescriptions  Pending Prescriptions Disp Refills   atorvastatin (LIPITOR) 10 MG tablet      Sig: Take 1 tablet (10 mg total) by mouth daily.     Cardiovascular:  Antilipid - Statins Failed - 08/31/2018 12:10 PM      Failed - Total Cholesterol in normal range and within 360 days    No results found for: CHOL, POCCHOL       Failed - LDL in normal range and within 360 days    No results found for: LDLCALC, LDLC, HIRISKLDL       Failed - HDL in normal range and within 360 days    No results found for: HDL       Failed - Triglycerides in normal range and within 360 days    No results found for: TRIG       Passed - Patient is not pregnant      Passed - Valid encounter within last 12 months    Recent Outpatient Visits          4 months ago Memory change   [facility] [place], [place], [place]   6 months ago Memory change   [facility] [place], [place], [place]   7 months ago Memory change   [facility] [place], [place], [place]

## 2018-08-31 NOTE — Telephone Encounter (Signed)
Copied from [place]. Topic: Quick Communication - Rx Refill/Question >> Aug 31, 2018 11:33 AM Selinda Flavin B, NT wrote: Medication: atorvastatin (LIPITOR) 10 MG tablet  Has the patient contacted their pharmacy? Yes. To contact office due to being expired  (Agent: If no, request that the patient contact the pharmacy for the refill.) (Agent: If yes, when and what did the pharmacy advise?)  Preferred Pharmacy (with phone number or street name): [facility]  Agent: Please be advised that RX refills may take up to 3 business days. We ask that you follow-up with your pharmacy.

## 2018-08-31 NOTE — Telephone Encounter (Signed)
Spoke with patient and made her aware that she would need to contact her pcp to have this refilled. She verbalized understanding and appreciation for my call.

## 2018-11-08 ENCOUNTER — Other Ambulatory Visit: Payer: Self-pay | Admitting: Family Medicine

## 2018-11-18 ENCOUNTER — Ambulatory Visit (INDEPENDENT_AMBULATORY_CARE_PROVIDER_SITE_OTHER): Payer: Medicare Other | Admitting: *Deleted

## 2018-11-18 ENCOUNTER — Other Ambulatory Visit: Payer: Self-pay

## 2018-11-18 DIAGNOSIS — I495 Sick sinus syndrome: Secondary | ICD-10-CM | POA: Diagnosis not present

## 2018-11-19 ENCOUNTER — Other Ambulatory Visit: Payer: Self-pay | Admitting: Family Medicine

## 2018-11-19 LAB — CUP PACEART REMOTE DEVICE CHECK
Battery Remaining Longevity: 90 mo
Battery Remaining Percentage: 100 %
Brady Statistic RA Percent Paced: 34 %
Brady Statistic RV Percent Paced: 0 %
Date Time Interrogation Session: 20200408065700
Implantable Lead Implant Date: 20100415
Implantable Lead Implant Date: 20100415
Implantable Lead Location: 753859
Implantable Lead Location: 753860
Implantable Lead Model: 5076
Implantable Lead Model: 5076
Implantable Pulse Generator Implant Date: 20180416
Lead Channel Impedance Value: 510 Ohm
Lead Channel Impedance Value: 575 Ohm
Lead Channel Pacing Threshold Amplitude: 0.4 V
Lead Channel Pacing Threshold Amplitude: 1 V
Lead Channel Pacing Threshold Pulse Width: 0.4 ms
Lead Channel Pacing Threshold Pulse Width: 0.4 ms
Lead Channel Setting Pacing Amplitude: 2 V
Lead Channel Setting Pacing Amplitude: 2 V
Lead Channel Setting Pacing Pulse Width: 0.4 ms
Lead Channel Setting Sensing Sensitivity: 2.5 mV
Pulse Gen Serial Number: 765776

## 2018-11-23 ENCOUNTER — Other Ambulatory Visit: Payer: Self-pay | Admitting: Family Medicine

## 2018-11-23 ENCOUNTER — Telehealth: Payer: Self-pay

## 2018-11-23 MED ORDER — FUROSEMIDE 40 MG PO TABS: 40.0000 mg | ORAL_TABLET | Freq: Every day | ORAL | 2 refills | Status: DC

## 2018-11-23 MED ORDER — POTASSIUM CHLORIDE CRYS ER 20 MEQ PO TBCR: 20.0000 meq | EXTENDED_RELEASE_TABLET | Freq: Every day | ORAL | 2 refills | Status: AC

## 2018-11-23 NOTE — Telephone Encounter (Signed)
Erroneous

## 2018-11-27 ENCOUNTER — Encounter: Payer: Self-pay | Admitting: Cardiology

## 2018-11-27 NOTE — Progress Notes (Signed)
Remote pacemaker transmission.   

## 2019-02-04 ENCOUNTER — Other Ambulatory Visit: Payer: Self-pay | Admitting: Family Medicine

## 2019-02-04 DIAGNOSIS — G459 Transient cerebral ischemic attack, unspecified: Secondary | ICD-10-CM

## 2019-02-04 NOTE — Telephone Encounter (Signed)
Pt requested Rx refill. Pt LOV 04/2018.

## 2019-02-17 ENCOUNTER — Ambulatory Visit (INDEPENDENT_AMBULATORY_CARE_PROVIDER_SITE_OTHER): Payer: Medicare Other | Admitting: *Deleted

## 2019-02-17 DIAGNOSIS — I495 Sick sinus syndrome: Secondary | ICD-10-CM | POA: Diagnosis not present

## 2019-02-17 LAB — CUP PACEART REMOTE DEVICE CHECK
Battery Remaining Longevity: 84 mo
Battery Remaining Percentage: 100 %
Brady Statistic RA Percent Paced: 33 %
Brady Statistic RV Percent Paced: 0 %
Date Time Interrogation Session: 20200708064300
Implantable Lead Implant Date: 20100415
Implantable Lead Implant Date: 20100415
Implantable Lead Location: 753859
Implantable Lead Location: 753860
Implantable Lead Model: 5076
Implantable Lead Model: 5076
Implantable Pulse Generator Implant Date: 20180416
Lead Channel Impedance Value: 568 Ohm
Lead Channel Impedance Value: 641 Ohm
Lead Channel Pacing Threshold Amplitude: 0.4 V
Lead Channel Pacing Threshold Amplitude: 1.1 V
Lead Channel Pacing Threshold Pulse Width: 0.4 ms
Lead Channel Pacing Threshold Pulse Width: 0.4 ms
Lead Channel Setting Pacing Amplitude: 2 V
Lead Channel Setting Pacing Amplitude: 2 V
Lead Channel Setting Pacing Pulse Width: 0.4 ms
Lead Channel Setting Sensing Sensitivity: 2.5 mV
Pulse Gen Serial Number: 765776

## 2019-03-01 ENCOUNTER — Encounter: Payer: Self-pay | Admitting: Cardiology

## 2019-03-01 NOTE — Progress Notes (Signed)
Remote pacemaker transmission.   

## 2019-03-25 ENCOUNTER — Other Ambulatory Visit: Payer: Self-pay

## 2019-03-25 DIAGNOSIS — Z20822 Contact with and (suspected) exposure to covid-19: Secondary | ICD-10-CM

## 2019-03-25 DIAGNOSIS — R6889 Other general symptoms and signs: Secondary | ICD-10-CM | POA: Diagnosis not present

## 2019-03-26 LAB — NOVEL CORONAVIRUS, NAA: SARS-CoV-2, NAA: NOT DETECTED

## 2019-04-26 DIAGNOSIS — Z23 Encounter for immunization: Secondary | ICD-10-CM | POA: Diagnosis not present

## 2019-05-19 ENCOUNTER — Ambulatory Visit (INDEPENDENT_AMBULATORY_CARE_PROVIDER_SITE_OTHER): Payer: Medicare Other | Admitting: *Deleted

## 2019-05-19 DIAGNOSIS — G459 Transient cerebral ischemic attack, unspecified: Secondary | ICD-10-CM

## 2019-05-20 LAB — CUP PACEART REMOTE DEVICE CHECK
Battery Remaining Longevity: 84 mo
Battery Remaining Percentage: 100 %
Brady Statistic RA Percent Paced: 31 %
Brady Statistic RV Percent Paced: 0 %
Date Time Interrogation Session: 20201007065700
Implantable Lead Implant Date: 20100415
Implantable Lead Implant Date: 20100415
Implantable Lead Location: 753859
Implantable Lead Location: 753860
Implantable Lead Model: 5076
Implantable Lead Model: 5076
Implantable Pulse Generator Implant Date: 20180416
Lead Channel Impedance Value: 556 Ohm
Lead Channel Impedance Value: 589 Ohm
Lead Channel Pacing Threshold Amplitude: 0.4 V
Lead Channel Pacing Threshold Amplitude: 1.1 V
Lead Channel Pacing Threshold Pulse Width: 0.4 ms
Lead Channel Pacing Threshold Pulse Width: 0.4 ms
Lead Channel Setting Pacing Amplitude: 2 V
Lead Channel Setting Pacing Amplitude: 2 V
Lead Channel Setting Pacing Pulse Width: 0.4 ms
Lead Channel Setting Sensing Sensitivity: 2.5 mV
Pulse Gen Serial Number: 765776

## 2019-05-25 ENCOUNTER — Other Ambulatory Visit: Payer: Self-pay | Admitting: Family Medicine

## 2019-05-31 NOTE — Progress Notes (Signed)
Remote pacemaker transmission.   

## 2019-06-01 DIAGNOSIS — F0390 Unspecified dementia without behavioral disturbance: Secondary | ICD-10-CM | POA: Diagnosis not present

## 2019-07-01 DIAGNOSIS — I10 Essential (primary) hypertension: Secondary | ICD-10-CM | POA: Diagnosis not present

## 2019-07-01 DIAGNOSIS — F0390 Unspecified dementia without behavioral disturbance: Secondary | ICD-10-CM | POA: Diagnosis not present

## 2019-07-01 DIAGNOSIS — Z0000 Encounter for general adult medical examination without abnormal findings: Secondary | ICD-10-CM | POA: Diagnosis not present

## 2019-07-01 DIAGNOSIS — Z950 Presence of cardiac pacemaker: Secondary | ICD-10-CM | POA: Diagnosis not present

## 2019-07-01 DIAGNOSIS — I679 Cerebrovascular disease, unspecified: Secondary | ICD-10-CM | POA: Diagnosis not present

## 2019-07-01 DIAGNOSIS — Z8673 Personal history of transient ischemic attack (TIA), and cerebral infarction without residual deficits: Secondary | ICD-10-CM | POA: Diagnosis not present

## 2019-07-01 DIAGNOSIS — R7303 Prediabetes: Secondary | ICD-10-CM | POA: Diagnosis not present

## 2019-07-01 DIAGNOSIS — E785 Hyperlipidemia, unspecified: Secondary | ICD-10-CM | POA: Diagnosis not present

## 2019-07-01 DIAGNOSIS — M810 Age-related osteoporosis without current pathological fracture: Secondary | ICD-10-CM | POA: Diagnosis not present

## 2019-08-24 ENCOUNTER — Ambulatory Visit (INDEPENDENT_AMBULATORY_CARE_PROVIDER_SITE_OTHER): Payer: Medicare Other | Admitting: *Deleted

## 2019-08-24 DIAGNOSIS — G459 Transient cerebral ischemic attack, unspecified: Secondary | ICD-10-CM | POA: Diagnosis not present

## 2019-08-25 ENCOUNTER — Telehealth: Payer: Self-pay | Admitting: Emergency Medicine

## 2019-08-25 NOTE — Telephone Encounter (Signed)
Patient was asymptomatic with episodes of NSVT on 91 day transmission. Needs to be scheduled for yearly follow-up with Dr Rayann Heman and would prefer in person visit. Contacted her son odd who is on DPR per p[atient request.[name] will need to be contacted to schedule appointment because he will be providing transportation to appointment.

## 2019-08-26 LAB — CUP PACEART REMOTE DEVICE CHECK
Battery Remaining Longevity: 84 mo
Battery Remaining Percentage: 100 %
Brady Statistic RA Percent Paced: 27 %
Brady Statistic RV Percent Paced: 0 %
Date Time Interrogation Session: 20210109164000
Implantable Lead Implant Date: 20100415
Implantable Lead Implant Date: 20100415
Implantable Lead Location: 753859
Implantable Lead Location: 753860
Implantable Lead Model: 5076
Implantable Lead Model: 5076
Implantable Pulse Generator Implant Date: 20180416
Lead Channel Impedance Value: 496 Ohm
Lead Channel Impedance Value: 564 Ohm
Lead Channel Pacing Threshold Amplitude: 0.4 V
Lead Channel Pacing Threshold Amplitude: 1.1 V
Lead Channel Pacing Threshold Pulse Width: 0.4 ms
Lead Channel Pacing Threshold Pulse Width: 0.4 ms
Lead Channel Setting Pacing Amplitude: 2 V
Lead Channel Setting Pacing Amplitude: 2 V
Lead Channel Setting Pacing Pulse Width: 0.4 ms
Lead Channel Setting Sensing Sensitivity: 2.5 mV
Pulse Gen Serial Number: 765776

## 2019-09-16 DIAGNOSIS — Z23 Encounter for immunization: Secondary | ICD-10-CM | POA: Diagnosis not present

## 2019-09-20 NOTE — Progress Notes (Signed)
Cardiology Office Note Date:  09/21/2019  Patient ID:  Sara Perkins, DOB [birth year], MRN [id number] PCP:  Lajean Manes, MD  Electrophysiologist  Dr. Rayann Heman    Chief Complaint:  annual device visit, NSVT recently  History of Present Illness: Sara Perkins is a 84 y.o. female with history of PPM, TIA 2008, 201-.  She comes in today to be seen for Dr. Rayann Heman.  She was seen by him jan 2020, as a new patient having recently moved from [place] after the death of her husband to be near her son.  She reported h/o syncope that led to PPM.  Some time after the PPM she again had syncope, this event felt 2/2 BP medicines.  She has a cath report dated 05/17/2016 mentions reduced LVEF, suspect 2/2 RV pacing.  She is doing well.  Denies any cardiac concerns or awareness.  No CP, palpitations or SOB.  Denies dizzy spells, near syncope or syncope. She denies SOB with ADLs, no symptoms of PND or orthopnea  Device information BSci dual chamber PPM implanted 11/24/2008 (in [place])  Past Medical History:  Diagnosis Date  . Broken foot 2017  . Chicken pox   . Syncope    Related to BP medication   . TIA (transient ischemic attack)   . UTI (urinary tract infection)     Past Surgical History:  Procedure Laterality Date  . ABDOMINAL HYSTERECTOMY    . APPENDECTOMY    . PACEMAKER GENERATOR CHANGE  11/25/2016   [employer] L 101 generator change performed in [place] in [place]  . PACEMAKER INSERTION  12/24/2008   MDT PPM implanted by Dr Joeseph Amor in [place]     Current Outpatient Medications  Medication Sig Dispense Refill  . atorvastatin (LIPITOR) 10 MG tablet TAKE 1 TABLET BY MOUTH  DAILY 90 tablet 3  . clopidogrel (PLAVIX) 75 MG tablet TAKE 1 TABLET BY MOUTH  DAILY 90 tablet 0  . furosemide (LASIX) 40 MG tablet Take 1 tablet (40 mg total) by mouth daily. 90 tablet 2  . Glucosamine-Chondroit-Vit C-Mn (GLUCOSAMINE 1500 COMPLEX PO) Take by mouth.    . potassium chloride SA (K-DUR,KLOR-CON)  20 MEQ tablet Take 1 tablet (20 mEq total) by mouth daily. 90 tablet 2  . VITAMIN D PO Take 600 mg by mouth daily.     No current facility-administered medications for this visit.    Allergies:   Patient has no known allergies.   Social History:  The patient  reports that she has never smoked. She has never used smokeless tobacco. She reports that she does not drink alcohol or use drugs.   Family History:  The patient's family history includes Kidney disease in her father.  ROS:  Please see the history of present illness.  All other systems are reviewed and otherwise negative.   PHYSICAL EXAM:  VS:  BP (!) 154/76   Pulse 66   Ht 5\' 6"  (1.676 m)   Wt 122 lb (55.3 kg)   BMI 19.69 kg/m  BMI: Body mass index is 19.69 kg/m. Well nourished, well developed, in no acute distress  HEENT: normocephalic, atraumatic  Neck: no JVD, carotid bruits or masses Cardiac:  RRR; no significant murmurs, no rubs, or gallops Lungs:  CAT b/l, no wheezing, rhonchi or rales  Abd: soft, nontender MS: no deformity, age appropriate atrophy Ext: 1+ LLE edema to just above ankle, trace right Skin: warm and dry, no rash Neuro:  No gross deficits appreciated Psych: euthymic mood, full affect  PPM site is stable, no tethering or discomfort   EKG:  Done today  And reviewed by myself shows  SR 66, LBBB  PPM interrogation done today and reviewed by myself;  Battery and lead testing is stable Presenting is AS/VS 70's AP 27% VP <1% NSVT episodes noted by device, longest 22 seconds 04/18/2019 Available EGMs reviewed Some are 1:1, some are NSVT RV output increaed to 2.5V clinic standard/2:1 safety margin    05/17/2016 LHC LVEF 45% RCA dominant LM normal LAD normal 1st diag norma 2nd diag normal LCX normal 1st OM normal 2nd OM normal RCA normal PDA normal  Recent Labs: No results found for requested labs within last 8760 hours.  No results found for requested labs within last 8760 hours.    CrCl cannot be calculated (Patient's most recent lab result is older than the maximum 21 days allowed.).   Wt Readings from Last 3 Encounters:  09/21/19 122 lb (55.3 kg)  08/19/18 125 lb (56.7 kg)  04/27/18 123 lb 3.2 oz (55.9 kg)     Other studies reviewed: Additional studies/records reviewed today include: summarized above  ASSESSMENT AND PLAN:  1. PPM     Intact function     Programmed as above  2. NICM     LVEF 45% by LV gram 2017     She has L>R LE edema, her sone reports this as baseline and not new, she does not like/routienly take her lasix     No symptoms of volume OL, lungs clear     Encouraged to take her medicines as prescribed  3. NSVT     Asymptomatic     She has hx or CM by LV gram     Will get BMET, mag level, echo  Pending results, her BP has room for BB, though if lytes and LV look OK, she also has h/o syncope suspect 2/2 medicines in the past Will await labs/echo for medicine decision   Disposition: Q 3 mo remotes, 53mo in clinic, sooner if needed, or need pending test results  Current medicines are reviewed at length with the patient today.  The patient did not have any concerns regarding medicines.  Venetia Night, PA-C 09/21/2019 4:42 PM     [address] [address] [place] [address] [phone] (office)  [phone] (fax)

## 2019-09-21 ENCOUNTER — Other Ambulatory Visit: Payer: Self-pay | Admitting: Geriatric Medicine

## 2019-09-21 ENCOUNTER — Ambulatory Visit
Admission: RE | Admit: 2019-09-21 | Discharge: 2019-09-21 | Disposition: A | Discharge status: Home / Self Care | Payer: Medicare Other | Source: Ambulatory Visit | Attending: Geriatric Medicine | Admitting: Geriatric Medicine

## 2019-09-21 ENCOUNTER — Encounter: Payer: Self-pay | Admitting: Physician Assistant

## 2019-09-21 ENCOUNTER — Ambulatory Visit (INDEPENDENT_AMBULATORY_CARE_PROVIDER_SITE_OTHER): Payer: Medicare Other | Admitting: Physician Assistant

## 2019-09-21 ENCOUNTER — Other Ambulatory Visit: Payer: Self-pay

## 2019-09-21 VITALS — BP 154/76 | HR 66 | Ht 66.0 in | Wt 122.0 lb

## 2019-09-21 DIAGNOSIS — I495 Sick sinus syndrome: Secondary | ICD-10-CM | POA: Diagnosis not present

## 2019-09-21 DIAGNOSIS — I4729 Other ventricular tachycardia: Secondary | ICD-10-CM

## 2019-09-21 DIAGNOSIS — G301 Alzheimer's disease with late onset: Secondary | ICD-10-CM | POA: Diagnosis not present

## 2019-09-21 DIAGNOSIS — Z950 Presence of cardiac pacemaker: Secondary | ICD-10-CM | POA: Diagnosis not present

## 2019-09-21 DIAGNOSIS — I472 Ventricular tachycardia: Secondary | ICD-10-CM | POA: Diagnosis not present

## 2019-09-21 DIAGNOSIS — I428 Other cardiomyopathies: Secondary | ICD-10-CM

## 2019-09-21 DIAGNOSIS — M545 Low back pain, unspecified: Secondary | ICD-10-CM

## 2019-09-21 DIAGNOSIS — F5101 Primary insomnia: Secondary | ICD-10-CM | POA: Diagnosis not present

## 2019-09-21 NOTE — Patient Instructions (Addendum)
Medication Instructions:   Your physician recommends that you continue on your current medications as directed. Please refer to the Current Medication list given to you today.  *If you need a refill on your cardiac medications before your next appointment, please call your pharmacy*  Lab Work:  BMET AND MAG   If you have labs (blood work) drawn today and your tests are completely normal, you will receive your results only by: Marland Kitchen MyChart Message (if you have MyChart) OR . A paper copy in the mail If you have any lab test that is abnormal or we need to change your treatment, we will call you to review the results.  Testing/Procedures: Your physician has requested that you have an echocardiogram. Echocardiography is a painless test that uses sound waves to create images of your heart. It provides your doctor with information about the size and shape of your heart and how well your heart's chambers and valves are working. This procedure takes approximately one hour. There are no restrictions for this procedure.  Follow-Up: At [facility], you and your health needs are our priority.  As part of our continuing mission to provide you with exceptional heart care, we have created designated Provider Care Teams.  These Care Teams include your primary Cardiologist (physician) and Advanced Practice Providers (APPs -  Physician Assistants and Nurse Practitioners) who all work together to provide you with the care you need, when you need it.   Your next appointment:   6 month(s)  The format for your next appointment:   In Person  Provider:   You may see  or one of the following Advanced Practice Providers on your designated Care Team: Tommye Standard, [place]   Other Instructions

## 2019-09-22 LAB — BASIC METABOLIC PANEL
BUN/Creatinine Ratio: 12 (ref 12–28)
BUN: 10 mg/dL (ref 8–27)
CO2: 24 mmol/L (ref 20–29)
Calcium: 9.7 mg/dL (ref 8.7–10.3)
Chloride: 96 mmol/L (ref 96–106)
Creatinine, Ser: 0.82 mg/dL (ref 0.57–1.00)
GFR calc Af Amer: 78 mL/min/{1.73_m2} (ref >59)
GFR calc non Af Amer: 67 mL/min/{1.73_m2} (ref >59)
Glucose: 99 mg/dL (ref 65–99)
Potassium: 4.3 mmol/L (ref 3.5–5.2)
Sodium: 135 mmol/L (ref 134–144)

## 2019-09-22 LAB — MAGNESIUM: Magnesium: 2.1 mg/dL (ref 1.6–2.3)

## 2019-10-05 ENCOUNTER — Ambulatory Visit (HOSPITAL_COMMUNITY): Payer: Medicare Other | Attending: Cardiology

## 2019-10-05 ENCOUNTER — Other Ambulatory Visit: Payer: Self-pay

## 2019-10-05 DIAGNOSIS — I4729 Other ventricular tachycardia: Secondary | ICD-10-CM

## 2019-10-05 DIAGNOSIS — Z950 Presence of cardiac pacemaker: Secondary | ICD-10-CM

## 2019-10-05 DIAGNOSIS — I340 Nonrheumatic mitral (valve) insufficiency: Secondary | ICD-10-CM | POA: Insufficient documentation

## 2019-10-05 DIAGNOSIS — I472 Ventricular tachycardia: Secondary | ICD-10-CM | POA: Diagnosis not present

## 2019-10-05 DIAGNOSIS — I429 Cardiomyopathy, unspecified: Secondary | ICD-10-CM | POA: Insufficient documentation

## 2019-10-05 DIAGNOSIS — Z8673 Personal history of transient ischemic attack (TIA), and cerebral infarction without residual deficits: Secondary | ICD-10-CM | POA: Insufficient documentation

## 2019-10-05 DIAGNOSIS — I351 Nonrheumatic aortic (valve) insufficiency: Secondary | ICD-10-CM | POA: Diagnosis not present

## 2019-10-06 ENCOUNTER — Telehealth: Payer: Self-pay | Admitting: Physician Assistant

## 2019-10-06 ENCOUNTER — Other Ambulatory Visit: Payer: Self-pay | Admitting: *Deleted

## 2019-10-06 MED ORDER — METOPROLOL SUCCINATE ER 25 MG PO TB24: 25.0000 mg | ORAL_TABLET | Freq: Every day | ORAL | 1 refills | Status: DC

## 2019-10-06 NOTE — Progress Notes (Signed)
oprox

## 2019-10-06 NOTE — Telephone Encounter (Signed)
New Message   Pt's son Vivien Rota calling to obtain pt's echo results done on 10/05/19.  Please call.

## 2019-10-14 DIAGNOSIS — Z23 Encounter for immunization: Secondary | ICD-10-CM | POA: Diagnosis not present

## 2019-10-25 ENCOUNTER — Other Ambulatory Visit: Payer: Self-pay | Admitting: Geriatric Medicine

## 2019-10-25 DIAGNOSIS — M545 Low back pain, unspecified: Secondary | ICD-10-CM

## 2019-10-27 ENCOUNTER — Other Ambulatory Visit: Payer: Self-pay | Admitting: Geriatric Medicine

## 2019-10-27 DIAGNOSIS — M545 Low back pain, unspecified: Secondary | ICD-10-CM

## 2019-11-03 DIAGNOSIS — G301 Alzheimer's disease with late onset: Secondary | ICD-10-CM | POA: Diagnosis not present

## 2019-11-10 ENCOUNTER — Ambulatory Visit
Admission: RE | Admit: 2019-11-10 | Discharge: 2019-11-10 | Disposition: A | Discharge status: Home / Self Care | Payer: Medicare Other | Source: Ambulatory Visit | Attending: Geriatric Medicine | Admitting: Geriatric Medicine

## 2019-11-10 DIAGNOSIS — S32030A Wedge compression fracture of third lumbar vertebra, initial encounter for closed fracture: Secondary | ICD-10-CM | POA: Diagnosis not present

## 2019-11-10 DIAGNOSIS — M545 Low back pain, unspecified: Secondary | ICD-10-CM

## 2019-11-23 ENCOUNTER — Ambulatory Visit (INDEPENDENT_AMBULATORY_CARE_PROVIDER_SITE_OTHER): Payer: Medicare Other | Admitting: *Deleted

## 2019-11-23 DIAGNOSIS — G459 Transient cerebral ischemic attack, unspecified: Secondary | ICD-10-CM | POA: Diagnosis not present

## 2019-11-23 LAB — CUP PACEART REMOTE DEVICE CHECK
Battery Remaining Longevity: 78 mo
Battery Remaining Percentage: 100 %
Brady Statistic RA Percent Paced: 18 %
Brady Statistic RV Percent Paced: 0 %
Date Time Interrogation Session: 20210413024500
Implantable Lead Implant Date: 20100415
Implantable Lead Implant Date: 20100415
Implantable Lead Location: 753859
Implantable Lead Location: 753860
Implantable Lead Model: 5076
Implantable Lead Model: 5076
Implantable Pulse Generator Implant Date: 20180416
Lead Channel Impedance Value: 495 Ohm
Lead Channel Impedance Value: 502 Ohm
Lead Channel Pacing Threshold Amplitude: 0.4 V
Lead Channel Pacing Threshold Amplitude: 1.1 V
Lead Channel Pacing Threshold Pulse Width: 0.4 ms
Lead Channel Pacing Threshold Pulse Width: 0.4 ms
Lead Channel Setting Pacing Amplitude: 2 V
Lead Channel Setting Pacing Amplitude: 2.5 V
Lead Channel Setting Pacing Pulse Width: 0.4 ms
Lead Channel Setting Sensing Sensitivity: 2.5 mV
Pulse Gen Serial Number: 765776

## 2019-11-24 NOTE — Progress Notes (Signed)
PPM Remote  

## 2019-11-28 DIAGNOSIS — Z950 Presence of cardiac pacemaker: Secondary | ICD-10-CM | POA: Diagnosis not present

## 2019-11-28 DIAGNOSIS — R32 Unspecified urinary incontinence: Secondary | ICD-10-CM | POA: Diagnosis not present

## 2019-11-28 DIAGNOSIS — Z7902 Long term (current) use of antithrombotics/antiplatelets: Secondary | ICD-10-CM | POA: Diagnosis not present

## 2019-11-28 DIAGNOSIS — E785 Hyperlipidemia, unspecified: Secondary | ICD-10-CM | POA: Diagnosis not present

## 2019-11-28 DIAGNOSIS — M48061 Spinal stenosis, lumbar region without neurogenic claudication: Secondary | ICD-10-CM | POA: Diagnosis not present

## 2019-11-28 DIAGNOSIS — F5101 Primary insomnia: Secondary | ICD-10-CM | POA: Diagnosis not present

## 2019-11-28 DIAGNOSIS — F0280 Dementia in other diseases classified elsewhere without behavioral disturbance: Secondary | ICD-10-CM | POA: Diagnosis not present

## 2019-11-28 DIAGNOSIS — G301 Alzheimer's disease with late onset: Secondary | ICD-10-CM | POA: Diagnosis not present

## 2019-11-28 DIAGNOSIS — M4856XD Collapsed vertebra, not elsewhere classified, lumbar region, subsequent encounter for fracture with routine healing: Secondary | ICD-10-CM | POA: Diagnosis not present

## 2019-12-02 DIAGNOSIS — F5101 Primary insomnia: Secondary | ICD-10-CM | POA: Diagnosis not present

## 2019-12-02 DIAGNOSIS — E785 Hyperlipidemia, unspecified: Secondary | ICD-10-CM | POA: Diagnosis not present

## 2019-12-02 DIAGNOSIS — G301 Alzheimer's disease with late onset: Secondary | ICD-10-CM | POA: Diagnosis not present

## 2019-12-02 DIAGNOSIS — M48061 Spinal stenosis, lumbar region without neurogenic claudication: Secondary | ICD-10-CM | POA: Diagnosis not present

## 2019-12-02 DIAGNOSIS — M4856XD Collapsed vertebra, not elsewhere classified, lumbar region, subsequent encounter for fracture with routine healing: Secondary | ICD-10-CM | POA: Diagnosis not present

## 2019-12-02 DIAGNOSIS — F0280 Dementia in other diseases classified elsewhere without behavioral disturbance: Secondary | ICD-10-CM | POA: Diagnosis not present

## 2019-12-06 DIAGNOSIS — G301 Alzheimer's disease with late onset: Secondary | ICD-10-CM | POA: Diagnosis not present

## 2019-12-06 DIAGNOSIS — F5101 Primary insomnia: Secondary | ICD-10-CM | POA: Diagnosis not present

## 2019-12-06 DIAGNOSIS — F0280 Dementia in other diseases classified elsewhere without behavioral disturbance: Secondary | ICD-10-CM | POA: Diagnosis not present

## 2019-12-06 DIAGNOSIS — M4856XD Collapsed vertebra, not elsewhere classified, lumbar region, subsequent encounter for fracture with routine healing: Secondary | ICD-10-CM | POA: Diagnosis not present

## 2019-12-06 DIAGNOSIS — E785 Hyperlipidemia, unspecified: Secondary | ICD-10-CM | POA: Diagnosis not present

## 2019-12-06 DIAGNOSIS — M48061 Spinal stenosis, lumbar region without neurogenic claudication: Secondary | ICD-10-CM | POA: Diagnosis not present

## 2019-12-09 DIAGNOSIS — M4856XD Collapsed vertebra, not elsewhere classified, lumbar region, subsequent encounter for fracture with routine healing: Secondary | ICD-10-CM | POA: Diagnosis not present

## 2019-12-09 DIAGNOSIS — F5101 Primary insomnia: Secondary | ICD-10-CM | POA: Diagnosis not present

## 2019-12-09 DIAGNOSIS — M48061 Spinal stenosis, lumbar region without neurogenic claudication: Secondary | ICD-10-CM | POA: Diagnosis not present

## 2019-12-09 DIAGNOSIS — G301 Alzheimer's disease with late onset: Secondary | ICD-10-CM | POA: Diagnosis not present

## 2019-12-09 DIAGNOSIS — E785 Hyperlipidemia, unspecified: Secondary | ICD-10-CM | POA: Diagnosis not present

## 2019-12-09 DIAGNOSIS — F0280 Dementia in other diseases classified elsewhere without behavioral disturbance: Secondary | ICD-10-CM | POA: Diagnosis not present

## 2019-12-14 DIAGNOSIS — M48061 Spinal stenosis, lumbar region without neurogenic claudication: Secondary | ICD-10-CM | POA: Diagnosis not present

## 2019-12-14 DIAGNOSIS — F0280 Dementia in other diseases classified elsewhere without behavioral disturbance: Secondary | ICD-10-CM | POA: Diagnosis not present

## 2019-12-14 DIAGNOSIS — F5101 Primary insomnia: Secondary | ICD-10-CM | POA: Diagnosis not present

## 2019-12-14 DIAGNOSIS — E785 Hyperlipidemia, unspecified: Secondary | ICD-10-CM | POA: Diagnosis not present

## 2019-12-14 DIAGNOSIS — M4856XD Collapsed vertebra, not elsewhere classified, lumbar region, subsequent encounter for fracture with routine healing: Secondary | ICD-10-CM | POA: Diagnosis not present

## 2019-12-14 DIAGNOSIS — G301 Alzheimer's disease with late onset: Secondary | ICD-10-CM | POA: Diagnosis not present

## 2019-12-16 DIAGNOSIS — E785 Hyperlipidemia, unspecified: Secondary | ICD-10-CM | POA: Diagnosis not present

## 2019-12-16 DIAGNOSIS — M48061 Spinal stenosis, lumbar region without neurogenic claudication: Secondary | ICD-10-CM | POA: Diagnosis not present

## 2019-12-16 DIAGNOSIS — M4856XD Collapsed vertebra, not elsewhere classified, lumbar region, subsequent encounter for fracture with routine healing: Secondary | ICD-10-CM | POA: Diagnosis not present

## 2019-12-16 DIAGNOSIS — G301 Alzheimer's disease with late onset: Secondary | ICD-10-CM | POA: Diagnosis not present

## 2019-12-16 DIAGNOSIS — F5101 Primary insomnia: Secondary | ICD-10-CM | POA: Diagnosis not present

## 2019-12-16 DIAGNOSIS — F0280 Dementia in other diseases classified elsewhere without behavioral disturbance: Secondary | ICD-10-CM | POA: Diagnosis not present

## 2019-12-21 DIAGNOSIS — M4856XD Collapsed vertebra, not elsewhere classified, lumbar region, subsequent encounter for fracture with routine healing: Secondary | ICD-10-CM | POA: Diagnosis not present

## 2019-12-21 DIAGNOSIS — E785 Hyperlipidemia, unspecified: Secondary | ICD-10-CM | POA: Diagnosis not present

## 2019-12-21 DIAGNOSIS — G301 Alzheimer's disease with late onset: Secondary | ICD-10-CM | POA: Diagnosis not present

## 2019-12-21 DIAGNOSIS — F0280 Dementia in other diseases classified elsewhere without behavioral disturbance: Secondary | ICD-10-CM | POA: Diagnosis not present

## 2019-12-21 DIAGNOSIS — M48061 Spinal stenosis, lumbar region without neurogenic claudication: Secondary | ICD-10-CM | POA: Diagnosis not present

## 2019-12-21 DIAGNOSIS — F5101 Primary insomnia: Secondary | ICD-10-CM | POA: Diagnosis not present

## 2019-12-23 DIAGNOSIS — G301 Alzheimer's disease with late onset: Secondary | ICD-10-CM | POA: Diagnosis not present

## 2019-12-23 DIAGNOSIS — F5101 Primary insomnia: Secondary | ICD-10-CM | POA: Diagnosis not present

## 2019-12-23 DIAGNOSIS — F0280 Dementia in other diseases classified elsewhere without behavioral disturbance: Secondary | ICD-10-CM | POA: Diagnosis not present

## 2019-12-23 DIAGNOSIS — M4856XD Collapsed vertebra, not elsewhere classified, lumbar region, subsequent encounter for fracture with routine healing: Secondary | ICD-10-CM | POA: Diagnosis not present

## 2019-12-23 DIAGNOSIS — M48061 Spinal stenosis, lumbar region without neurogenic claudication: Secondary | ICD-10-CM | POA: Diagnosis not present

## 2019-12-23 DIAGNOSIS — E785 Hyperlipidemia, unspecified: Secondary | ICD-10-CM | POA: Diagnosis not present

## 2019-12-28 DIAGNOSIS — M48061 Spinal stenosis, lumbar region without neurogenic claudication: Secondary | ICD-10-CM | POA: Diagnosis not present

## 2019-12-29 DIAGNOSIS — R278 Other lack of coordination: Secondary | ICD-10-CM | POA: Diagnosis not present

## 2019-12-29 DIAGNOSIS — G309 Alzheimer's disease, unspecified: Secondary | ICD-10-CM | POA: Diagnosis not present

## 2020-01-05 DIAGNOSIS — G309 Alzheimer's disease, unspecified: Secondary | ICD-10-CM | POA: Diagnosis not present

## 2020-01-05 DIAGNOSIS — R278 Other lack of coordination: Secondary | ICD-10-CM | POA: Diagnosis not present

## 2020-01-12 DIAGNOSIS — R278 Other lack of coordination: Secondary | ICD-10-CM | POA: Diagnosis not present

## 2020-01-12 DIAGNOSIS — G309 Alzheimer's disease, unspecified: Secondary | ICD-10-CM | POA: Diagnosis not present

## 2020-01-19 DIAGNOSIS — G309 Alzheimer's disease, unspecified: Secondary | ICD-10-CM | POA: Diagnosis not present

## 2020-01-19 DIAGNOSIS — R278 Other lack of coordination: Secondary | ICD-10-CM | POA: Diagnosis not present

## 2020-02-02 ENCOUNTER — Other Ambulatory Visit: Payer: Self-pay | Admitting: Geriatric Medicine

## 2020-02-02 DIAGNOSIS — S32030D Wedge compression fracture of third lumbar vertebra, subsequent encounter for fracture with routine healing: Secondary | ICD-10-CM

## 2020-02-22 ENCOUNTER — Ambulatory Visit (INDEPENDENT_AMBULATORY_CARE_PROVIDER_SITE_OTHER): Payer: Medicare Other | Admitting: *Deleted

## 2020-02-22 DIAGNOSIS — I472 Ventricular tachycardia: Secondary | ICD-10-CM

## 2020-02-22 DIAGNOSIS — I4729 Other ventricular tachycardia: Secondary | ICD-10-CM

## 2020-02-22 LAB — CUP PACEART REMOTE DEVICE CHECK
Battery Remaining Longevity: 72 mo
Battery Remaining Percentage: 100 %
Brady Statistic RA Percent Paced: 25 %
Brady Statistic RV Percent Paced: 0 %
Date Time Interrogation Session: 20210713030400
Implantable Lead Implant Date: 20100415
Implantable Lead Implant Date: 20100415
Implantable Lead Location: 753859
Implantable Lead Location: 753860
Implantable Lead Model: 5076
Implantable Lead Model: 5076
Implantable Pulse Generator Implant Date: 20180416
Lead Channel Impedance Value: 506 Ohm
Lead Channel Impedance Value: 537 Ohm
Lead Channel Pacing Threshold Amplitude: 0.4 V
Lead Channel Pacing Threshold Amplitude: 1.1 V
Lead Channel Pacing Threshold Pulse Width: 0.4 ms
Lead Channel Pacing Threshold Pulse Width: 0.4 ms
Lead Channel Setting Pacing Amplitude: 2 V
Lead Channel Setting Pacing Amplitude: 2.5 V
Lead Channel Setting Pacing Pulse Width: 0.4 ms
Lead Channel Setting Sensing Sensitivity: 2.5 mV
Pulse Gen Serial Number: 765776

## 2020-02-23 NOTE — Progress Notes (Signed)
Remote pacemaker transmission.   

## 2020-04-24 ENCOUNTER — Ambulatory Visit
Admission: RE | Admit: 2020-04-24 | Discharge: 2020-04-24 | Disposition: A | Discharge status: Home / Self Care | Payer: Medicare Other | Source: Ambulatory Visit | Attending: Geriatric Medicine | Admitting: Geriatric Medicine

## 2020-04-24 ENCOUNTER — Other Ambulatory Visit: Payer: Self-pay

## 2020-04-24 DIAGNOSIS — Z780 Asymptomatic menopausal state: Secondary | ICD-10-CM | POA: Diagnosis not present

## 2020-04-24 DIAGNOSIS — M8588 Other specified disorders of bone density and structure, other site: Secondary | ICD-10-CM | POA: Diagnosis not present

## 2020-04-24 DIAGNOSIS — S32030D Wedge compression fracture of third lumbar vertebra, subsequent encounter for fracture with routine healing: Secondary | ICD-10-CM

## 2020-04-24 DIAGNOSIS — M810 Age-related osteoporosis without current pathological fracture: Secondary | ICD-10-CM | POA: Diagnosis not present

## 2020-05-23 ENCOUNTER — Ambulatory Visit (INDEPENDENT_AMBULATORY_CARE_PROVIDER_SITE_OTHER): Payer: Medicare Other

## 2020-05-23 DIAGNOSIS — G459 Transient cerebral ischemic attack, unspecified: Secondary | ICD-10-CM | POA: Diagnosis not present

## 2020-05-25 LAB — CUP PACEART REMOTE DEVICE CHECK
Battery Remaining Longevity: 72 mo
Battery Remaining Percentage: 100 %
Brady Statistic RA Percent Paced: 31 %
Brady Statistic RV Percent Paced: 0 %
Date Time Interrogation Session: 20211012023100
Implantable Lead Implant Date: 20100415
Implantable Lead Implant Date: 20100415
Implantable Lead Location: 753859
Implantable Lead Location: 753860
Implantable Lead Model: 5076
Implantable Lead Model: 5076
Implantable Pulse Generator Implant Date: 20180416
Lead Channel Impedance Value: 516 Ohm
Lead Channel Impedance Value: 550 Ohm
Lead Channel Pacing Threshold Amplitude: 0.4 V
Lead Channel Pacing Threshold Amplitude: 1.3 V
Lead Channel Pacing Threshold Pulse Width: 0.4 ms
Lead Channel Pacing Threshold Pulse Width: 0.4 ms
Lead Channel Setting Pacing Amplitude: 2 V
Lead Channel Setting Pacing Amplitude: 2.5 V
Lead Channel Setting Pacing Pulse Width: 0.4 ms
Lead Channel Setting Sensing Sensitivity: 2.5 mV
Pulse Gen Serial Number: 765776

## 2020-05-26 NOTE — Progress Notes (Signed)
Remote pacemaker transmission.   

## 2020-06-02 DIAGNOSIS — G301 Alzheimer's disease with late onset: Secondary | ICD-10-CM | POA: Diagnosis not present

## 2020-06-02 DIAGNOSIS — Z1389 Encounter for screening for other disorder: Secondary | ICD-10-CM | POA: Diagnosis not present

## 2020-06-02 DIAGNOSIS — Z23 Encounter for immunization: Secondary | ICD-10-CM | POA: Diagnosis not present

## 2020-06-02 DIAGNOSIS — I10 Essential (primary) hypertension: Secondary | ICD-10-CM | POA: Diagnosis not present

## 2020-06-02 DIAGNOSIS — Z0000 Encounter for general adult medical examination without abnormal findings: Secondary | ICD-10-CM | POA: Diagnosis not present

## 2020-06-02 DIAGNOSIS — M810 Age-related osteoporosis without current pathological fracture: Secondary | ICD-10-CM | POA: Diagnosis not present

## 2020-06-14 DIAGNOSIS — F0390 Unspecified dementia without behavioral disturbance: Secondary | ICD-10-CM | POA: Diagnosis not present

## 2020-06-14 DIAGNOSIS — G4700 Insomnia, unspecified: Secondary | ICD-10-CM | POA: Diagnosis not present

## 2020-06-14 DIAGNOSIS — F39 Unspecified mood [affective] disorder: Secondary | ICD-10-CM | POA: Diagnosis not present

## 2020-06-21 DIAGNOSIS — M810 Age-related osteoporosis without current pathological fracture: Secondary | ICD-10-CM | POA: Diagnosis not present

## 2020-06-21 DIAGNOSIS — E7800 Pure hypercholesterolemia, unspecified: Secondary | ICD-10-CM | POA: Diagnosis not present

## 2020-06-21 DIAGNOSIS — F0280 Dementia in other diseases classified elsewhere without behavioral disturbance: Secondary | ICD-10-CM | POA: Diagnosis not present

## 2020-06-21 DIAGNOSIS — G4709 Other insomnia: Secondary | ICD-10-CM | POA: Diagnosis not present

## 2020-06-21 DIAGNOSIS — G308 Other Alzheimer's disease: Secondary | ICD-10-CM | POA: Diagnosis not present

## 2020-07-12 DIAGNOSIS — F0390 Unspecified dementia without behavioral disturbance: Secondary | ICD-10-CM | POA: Diagnosis not present

## 2020-07-12 DIAGNOSIS — F39 Unspecified mood [affective] disorder: Secondary | ICD-10-CM | POA: Diagnosis not present

## 2020-07-12 DIAGNOSIS — G4700 Insomnia, unspecified: Secondary | ICD-10-CM | POA: Diagnosis not present

## 2020-07-27 DIAGNOSIS — G308 Other Alzheimer's disease: Secondary | ICD-10-CM | POA: Diagnosis not present

## 2020-07-27 DIAGNOSIS — F0280 Dementia in other diseases classified elsewhere without behavioral disturbance: Secondary | ICD-10-CM | POA: Diagnosis not present

## 2020-08-01 DIAGNOSIS — E785 Hyperlipidemia, unspecified: Secondary | ICD-10-CM | POA: Diagnosis not present

## 2020-08-01 DIAGNOSIS — F0151 Vascular dementia with behavioral disturbance: Secondary | ICD-10-CM | POA: Diagnosis not present

## 2020-08-03 DIAGNOSIS — F0281 Dementia in other diseases classified elsewhere with behavioral disturbance: Secondary | ICD-10-CM | POA: Diagnosis not present

## 2020-08-03 DIAGNOSIS — G308 Other Alzheimer's disease: Secondary | ICD-10-CM | POA: Diagnosis not present

## 2020-08-03 DIAGNOSIS — Z79899 Other long term (current) drug therapy: Secondary | ICD-10-CM | POA: Diagnosis not present

## 2020-08-22 ENCOUNTER — Ambulatory Visit (INDEPENDENT_AMBULATORY_CARE_PROVIDER_SITE_OTHER): Payer: Medicare Other

## 2020-08-22 DIAGNOSIS — I495 Sick sinus syndrome: Secondary | ICD-10-CM | POA: Diagnosis not present

## 2020-08-23 LAB — CUP PACEART REMOTE DEVICE CHECK
Battery Remaining Longevity: 72 mo
Battery Remaining Percentage: 100 %
Brady Statistic RA Percent Paced: 32 %
Brady Statistic RV Percent Paced: 0 %
Date Time Interrogation Session: 20220111023100
Implantable Lead Implant Date: 20100415
Implantable Lead Implant Date: 20100415
Implantable Lead Location: 753859
Implantable Lead Location: 753860
Implantable Lead Model: 5076
Implantable Lead Model: 5076
Implantable Pulse Generator Implant Date: 20180416
Lead Channel Impedance Value: 545 Ohm
Lead Channel Impedance Value: 576 Ohm
Lead Channel Pacing Threshold Amplitude: 0.5 V
Lead Channel Pacing Threshold Amplitude: 1.1 V
Lead Channel Pacing Threshold Pulse Width: 0.4 ms
Lead Channel Pacing Threshold Pulse Width: 0.4 ms
Lead Channel Setting Pacing Amplitude: 2 V
Lead Channel Setting Pacing Amplitude: 2.5 V
Lead Channel Setting Pacing Pulse Width: 0.4 ms
Lead Channel Setting Sensing Sensitivity: 2.5 mV
Pulse Gen Serial Number: 765776

## 2020-08-31 DIAGNOSIS — G4700 Insomnia, unspecified: Secondary | ICD-10-CM | POA: Diagnosis not present

## 2020-08-31 DIAGNOSIS — F39 Unspecified mood [affective] disorder: Secondary | ICD-10-CM | POA: Diagnosis not present

## 2020-08-31 DIAGNOSIS — F0390 Unspecified dementia without behavioral disturbance: Secondary | ICD-10-CM | POA: Diagnosis not present

## 2020-09-08 NOTE — Progress Notes (Signed)
Remote pacemaker transmission.   

## 2020-09-28 DIAGNOSIS — Z23 Encounter for immunization: Secondary | ICD-10-CM | POA: Diagnosis not present

## 2020-10-02 DIAGNOSIS — G301 Alzheimer's disease with late onset: Secondary | ICD-10-CM | POA: Diagnosis not present

## 2020-10-02 DIAGNOSIS — I10 Essential (primary) hypertension: Secondary | ICD-10-CM | POA: Diagnosis not present

## 2020-10-02 DIAGNOSIS — Z79899 Other long term (current) drug therapy: Secondary | ICD-10-CM | POA: Diagnosis not present

## 2020-10-06 DIAGNOSIS — R600 Localized edema: Secondary | ICD-10-CM | POA: Diagnosis not present

## 2020-10-06 DIAGNOSIS — M810 Age-related osteoporosis without current pathological fracture: Secondary | ICD-10-CM | POA: Diagnosis not present

## 2020-10-06 DIAGNOSIS — G308 Other Alzheimer's disease: Secondary | ICD-10-CM | POA: Diagnosis not present

## 2020-10-06 DIAGNOSIS — E538 Deficiency of other specified B group vitamins: Secondary | ICD-10-CM | POA: Diagnosis not present

## 2020-10-06 DIAGNOSIS — E7849 Other hyperlipidemia: Secondary | ICD-10-CM | POA: Diagnosis not present

## 2020-10-06 DIAGNOSIS — F0280 Dementia in other diseases classified elsewhere without behavioral disturbance: Secondary | ICD-10-CM | POA: Diagnosis not present

## 2020-10-06 DIAGNOSIS — G478 Other sleep disorders: Secondary | ICD-10-CM | POA: Diagnosis not present

## 2020-10-10 DIAGNOSIS — E785 Hyperlipidemia, unspecified: Secondary | ICD-10-CM | POA: Diagnosis not present

## 2020-10-10 DIAGNOSIS — E559 Vitamin D deficiency, unspecified: Secondary | ICD-10-CM | POA: Diagnosis not present

## 2020-10-10 DIAGNOSIS — D519 Vitamin B12 deficiency anemia, unspecified: Secondary | ICD-10-CM | POA: Diagnosis not present

## 2020-10-10 DIAGNOSIS — F0151 Vascular dementia with behavioral disturbance: Secondary | ICD-10-CM | POA: Diagnosis not present

## 2020-10-12 DIAGNOSIS — F39 Unspecified mood [affective] disorder: Secondary | ICD-10-CM | POA: Diagnosis not present

## 2020-10-12 DIAGNOSIS — G4700 Insomnia, unspecified: Secondary | ICD-10-CM | POA: Diagnosis not present

## 2020-10-12 DIAGNOSIS — F0390 Unspecified dementia without behavioral disturbance: Secondary | ICD-10-CM | POA: Diagnosis not present

## 2020-11-06 DIAGNOSIS — R7309 Other abnormal glucose: Secondary | ICD-10-CM | POA: Diagnosis not present

## 2020-11-06 DIAGNOSIS — R600 Localized edema: Secondary | ICD-10-CM | POA: Diagnosis not present

## 2020-11-06 DIAGNOSIS — G308 Other Alzheimer's disease: Secondary | ICD-10-CM | POA: Diagnosis not present

## 2020-11-06 DIAGNOSIS — N2889 Other specified disorders of kidney and ureter: Secondary | ICD-10-CM | POA: Diagnosis not present

## 2020-11-06 DIAGNOSIS — Z789 Other specified health status: Secondary | ICD-10-CM | POA: Diagnosis not present

## 2020-11-06 DIAGNOSIS — F0280 Dementia in other diseases classified elsewhere without behavioral disturbance: Secondary | ICD-10-CM | POA: Diagnosis not present

## 2020-11-06 DIAGNOSIS — F3489 Other specified persistent mood disorders: Secondary | ICD-10-CM | POA: Diagnosis not present

## 2020-11-06 DIAGNOSIS — E7849 Other hyperlipidemia: Secondary | ICD-10-CM | POA: Diagnosis not present

## 2020-11-06 DIAGNOSIS — M810 Age-related osteoporosis without current pathological fracture: Secondary | ICD-10-CM | POA: Diagnosis not present

## 2020-11-21 ENCOUNTER — Ambulatory Visit (INDEPENDENT_AMBULATORY_CARE_PROVIDER_SITE_OTHER): Payer: Medicare Other

## 2020-11-21 DIAGNOSIS — I495 Sick sinus syndrome: Secondary | ICD-10-CM | POA: Diagnosis not present

## 2020-11-21 LAB — CUP PACEART REMOTE DEVICE CHECK
Battery Remaining Longevity: 66 mo
Battery Remaining Percentage: 100 %
Brady Statistic RA Percent Paced: 32 %
Brady Statistic RV Percent Paced: 0 %
Date Time Interrogation Session: 20220412023100
Implantable Lead Implant Date: 20100415
Implantable Lead Implant Date: 20100415
Implantable Lead Location: 753859
Implantable Lead Location: 753860
Implantable Lead Model: 5076
Implantable Lead Model: 5076
Implantable Pulse Generator Implant Date: 20180416
Lead Channel Impedance Value: 511 Ohm
Lead Channel Impedance Value: 550 Ohm
Lead Channel Pacing Threshold Amplitude: 0.4 V
Lead Channel Pacing Threshold Amplitude: 1.3 V
Lead Channel Pacing Threshold Pulse Width: 0.4 ms
Lead Channel Pacing Threshold Pulse Width: 0.4 ms
Lead Channel Setting Pacing Amplitude: 2 V
Lead Channel Setting Pacing Amplitude: 2.5 V
Lead Channel Setting Pacing Pulse Width: 0.4 ms
Lead Channel Setting Sensing Sensitivity: 2.5 mV
Pulse Gen Serial Number: 765776

## 2020-12-06 NOTE — Progress Notes (Signed)
Remote pacemaker transmission.   

## 2020-12-07 DIAGNOSIS — F0390 Unspecified dementia without behavioral disturbance: Secondary | ICD-10-CM | POA: Diagnosis not present

## 2020-12-07 DIAGNOSIS — F39 Unspecified mood [affective] disorder: Secondary | ICD-10-CM | POA: Diagnosis not present

## 2020-12-07 DIAGNOSIS — G4700 Insomnia, unspecified: Secondary | ICD-10-CM | POA: Diagnosis not present

## 2020-12-12 DIAGNOSIS — F0151 Vascular dementia with behavioral disturbance: Secondary | ICD-10-CM | POA: Diagnosis not present

## 2020-12-28 DIAGNOSIS — Z23 Encounter for immunization: Secondary | ICD-10-CM | POA: Diagnosis not present

## 2021-01-02 DIAGNOSIS — E08311 Diabetes mellitus due to underlying condition with unspecified diabetic retinopathy with macular edema: Secondary | ICD-10-CM | POA: Diagnosis not present

## 2021-01-02 DIAGNOSIS — E039 Hypothyroidism, unspecified: Secondary | ICD-10-CM | POA: Diagnosis not present

## 2021-01-02 DIAGNOSIS — E559 Vitamin D deficiency, unspecified: Secondary | ICD-10-CM | POA: Diagnosis not present

## 2021-01-02 DIAGNOSIS — D519 Vitamin B12 deficiency anemia, unspecified: Secondary | ICD-10-CM | POA: Diagnosis not present

## 2021-01-04 DIAGNOSIS — E538 Deficiency of other specified B group vitamins: Secondary | ICD-10-CM | POA: Diagnosis not present

## 2021-01-04 DIAGNOSIS — N2889 Other specified disorders of kidney and ureter: Secondary | ICD-10-CM | POA: Diagnosis not present

## 2021-01-04 DIAGNOSIS — E7849 Other hyperlipidemia: Secondary | ICD-10-CM | POA: Diagnosis not present

## 2021-01-04 DIAGNOSIS — G308 Other Alzheimer's disease: Secondary | ICD-10-CM | POA: Diagnosis not present

## 2021-01-04 DIAGNOSIS — R7309 Other abnormal glucose: Secondary | ICD-10-CM | POA: Diagnosis not present

## 2021-01-06 DIAGNOSIS — R4182 Altered mental status, unspecified: Secondary | ICD-10-CM | POA: Diagnosis not present

## 2021-02-20 ENCOUNTER — Ambulatory Visit (INDEPENDENT_AMBULATORY_CARE_PROVIDER_SITE_OTHER): Payer: Medicare Other

## 2021-02-20 DIAGNOSIS — I495 Sick sinus syndrome: Secondary | ICD-10-CM | POA: Diagnosis not present

## 2021-02-20 LAB — CUP PACEART REMOTE DEVICE CHECK
Battery Remaining Longevity: 66 mo
Battery Remaining Percentage: 100 %
Brady Statistic RA Percent Paced: 31 %
Brady Statistic RV Percent Paced: 0 %
Date Time Interrogation Session: 20220712023100
Implantable Lead Implant Date: 20100415
Implantable Lead Implant Date: 20100415
Implantable Lead Location: 753859
Implantable Lead Location: 753860
Implantable Lead Model: 5076
Implantable Lead Model: 5076
Implantable Pulse Generator Implant Date: 20180416
Lead Channel Impedance Value: 511 Ohm
Lead Channel Impedance Value: 575 Ohm
Lead Channel Pacing Threshold Amplitude: 0.4 V
Lead Channel Pacing Threshold Amplitude: 1 V
Lead Channel Pacing Threshold Pulse Width: 0.4 ms
Lead Channel Pacing Threshold Pulse Width: 0.4 ms
Lead Channel Setting Pacing Amplitude: 2 V
Lead Channel Setting Pacing Amplitude: 2.5 V
Lead Channel Setting Pacing Pulse Width: 0.4 ms
Lead Channel Setting Sensing Sensitivity: 2.5 mV
Pulse Gen Serial Number: 765776

## 2021-03-15 NOTE — Progress Notes (Signed)
Remote pacemaker transmission.   

## 2021-03-22 DIAGNOSIS — F39 Unspecified mood [affective] disorder: Secondary | ICD-10-CM | POA: Diagnosis not present

## 2021-03-22 DIAGNOSIS — F5101 Primary insomnia: Secondary | ICD-10-CM | POA: Diagnosis not present

## 2021-03-22 DIAGNOSIS — F0390 Unspecified dementia without behavioral disturbance: Secondary | ICD-10-CM | POA: Diagnosis not present

## 2021-03-29 DIAGNOSIS — G479 Sleep disorder, unspecified: Secondary | ICD-10-CM | POA: Diagnosis not present

## 2021-03-29 DIAGNOSIS — N289 Disorder of kidney and ureter, unspecified: Secondary | ICD-10-CM | POA: Diagnosis not present

## 2021-03-29 DIAGNOSIS — R609 Edema, unspecified: Secondary | ICD-10-CM | POA: Diagnosis not present

## 2021-03-29 DIAGNOSIS — R739 Hyperglycemia, unspecified: Secondary | ICD-10-CM | POA: Diagnosis not present

## 2021-04-03 DIAGNOSIS — R7303 Prediabetes: Secondary | ICD-10-CM | POA: Diagnosis not present

## 2021-04-03 DIAGNOSIS — I10 Essential (primary) hypertension: Secondary | ICD-10-CM | POA: Diagnosis not present

## 2021-04-03 DIAGNOSIS — E559 Vitamin D deficiency, unspecified: Secondary | ICD-10-CM | POA: Diagnosis not present

## 2021-04-03 DIAGNOSIS — R4182 Altered mental status, unspecified: Secondary | ICD-10-CM | POA: Diagnosis not present

## 2021-04-03 DIAGNOSIS — Z79899 Other long term (current) drug therapy: Secondary | ICD-10-CM | POA: Diagnosis not present

## 2021-05-17 DIAGNOSIS — F5101 Primary insomnia: Secondary | ICD-10-CM | POA: Diagnosis not present

## 2021-05-17 DIAGNOSIS — F0390 Unspecified dementia without behavioral disturbance: Secondary | ICD-10-CM | POA: Diagnosis not present

## 2021-05-17 DIAGNOSIS — F39 Unspecified mood [affective] disorder: Secondary | ICD-10-CM | POA: Diagnosis not present

## 2021-05-21 DIAGNOSIS — Z23 Encounter for immunization: Secondary | ICD-10-CM | POA: Diagnosis not present

## 2021-05-22 ENCOUNTER — Emergency Department (HOSPITAL_COMMUNITY): Payer: Medicare Other

## 2021-05-22 ENCOUNTER — Inpatient Hospital Stay (HOSPITAL_COMMUNITY)
Admission: EM | Admit: 2021-05-22 | Discharge: 2021-05-24 | DRG: 282 | Disposition: A | Discharge status: Home / Self Care | Payer: Medicare Other | Attending: Internal Medicine | Admitting: Internal Medicine

## 2021-05-22 ENCOUNTER — Encounter (HOSPITAL_COMMUNITY): Payer: Self-pay

## 2021-05-22 ENCOUNTER — Other Ambulatory Visit: Payer: Self-pay

## 2021-05-22 ENCOUNTER — Ambulatory Visit (INDEPENDENT_AMBULATORY_CARE_PROVIDER_SITE_OTHER): Payer: Medicare Other

## 2021-05-22 DIAGNOSIS — S0101XA Laceration without foreign body of scalp, initial encounter: Secondary | ICD-10-CM | POA: Diagnosis present

## 2021-05-22 DIAGNOSIS — Z8673 Personal history of transient ischemic attack (TIA), and cerebral infarction without residual deficits: Secondary | ICD-10-CM

## 2021-05-22 DIAGNOSIS — W19XXXA Unspecified fall, initial encounter: Secondary | ICD-10-CM | POA: Diagnosis present

## 2021-05-22 DIAGNOSIS — I248 Other forms of acute ischemic heart disease: Secondary | ICD-10-CM | POA: Diagnosis present

## 2021-05-22 DIAGNOSIS — G319 Degenerative disease of nervous system, unspecified: Secondary | ICD-10-CM | POA: Diagnosis not present

## 2021-05-22 DIAGNOSIS — Z950 Presence of cardiac pacemaker: Secondary | ICD-10-CM | POA: Diagnosis not present

## 2021-05-22 DIAGNOSIS — F0390 Unspecified dementia without behavioral disturbance: Secondary | ICD-10-CM | POA: Diagnosis present

## 2021-05-22 DIAGNOSIS — Z20822 Contact with and (suspected) exposure to covid-19: Secondary | ICD-10-CM | POA: Diagnosis present

## 2021-05-22 DIAGNOSIS — Y92129 Unspecified place in nursing home as the place of occurrence of the external cause: Secondary | ICD-10-CM

## 2021-05-22 DIAGNOSIS — I214 Non-ST elevation (NSTEMI) myocardial infarction: Secondary | ICD-10-CM | POA: Diagnosis not present

## 2021-05-22 DIAGNOSIS — I739 Peripheral vascular disease, unspecified: Secondary | ICD-10-CM | POA: Diagnosis not present

## 2021-05-22 DIAGNOSIS — Z79899 Other long term (current) drug therapy: Secondary | ICD-10-CM | POA: Diagnosis not present

## 2021-05-22 DIAGNOSIS — S0990XA Unspecified injury of head, initial encounter: Secondary | ICD-10-CM | POA: Diagnosis not present

## 2021-05-22 DIAGNOSIS — Y92009 Unspecified place in unspecified non-institutional (private) residence as the place of occurrence of the external cause: Secondary | ICD-10-CM

## 2021-05-22 DIAGNOSIS — R778 Other specified abnormalities of plasma proteins: Secondary | ICD-10-CM | POA: Diagnosis not present

## 2021-05-22 DIAGNOSIS — R7989 Other specified abnormal findings of blood chemistry: Secondary | ICD-10-CM | POA: Diagnosis not present

## 2021-05-22 DIAGNOSIS — S0091XA Abrasion of unspecified part of head, initial encounter: Secondary | ICD-10-CM | POA: Diagnosis not present

## 2021-05-22 DIAGNOSIS — R296 Repeated falls: Secondary | ICD-10-CM

## 2021-05-22 DIAGNOSIS — I495 Sick sinus syndrome: Secondary | ICD-10-CM | POA: Diagnosis not present

## 2021-05-22 DIAGNOSIS — S199XXA Unspecified injury of neck, initial encounter: Secondary | ICD-10-CM | POA: Diagnosis not present

## 2021-05-22 DIAGNOSIS — Z7902 Long term (current) use of antithrombotics/antiplatelets: Secondary | ICD-10-CM | POA: Diagnosis not present

## 2021-05-22 DIAGNOSIS — I241 Dressler's syndrome: Secondary | ICD-10-CM | POA: Diagnosis not present

## 2021-05-22 DIAGNOSIS — M4312 Spondylolisthesis, cervical region: Secondary | ICD-10-CM | POA: Diagnosis not present

## 2021-05-22 LAB — CBC WITH DIFFERENTIAL/PLATELET
Abs Immature Granulocytes: 0.06 10*3/uL (ref 0.00–0.07)
Basophils Absolute: 0.1 10*3/uL (ref 0.0–0.1)
Basophils Relative: 1 %
Eosinophils Absolute: 0.1 10*3/uL (ref 0.0–0.5)
Eosinophils Relative: 2 %
HCT: 43.5 % (ref 36.0–46.0)
Hemoglobin: 13.9 g/dL (ref 12.0–15.0)
Immature Granulocytes: 1 %
Lymphocytes Relative: 15 %
Lymphs Abs: 0.9 10*3/uL (ref 0.7–4.0)
MCH: 30.6 pg (ref 26.0–34.0)
MCHC: 32 g/dL (ref 30.0–36.0)
MCV: 95.8 fL (ref 80.0–100.0)
Monocytes Absolute: 0.6 10*3/uL (ref 0.1–1.0)
Monocytes Relative: 9 %
Neutro Abs: 4.4 10*3/uL (ref 1.7–7.7)
Neutrophils Relative %: 72 %
Platelets: 204 10*3/uL (ref 150–400)
RBC: 4.54 MIL/uL (ref 3.87–5.11)
RDW: 13.5 % (ref 11.5–15.5)
WBC: 6.2 10*3/uL (ref 4.0–10.5)
nRBC: 1 % — ABNORMAL HIGH (ref 0.0–0.2)

## 2021-05-22 LAB — COMPREHENSIVE METABOLIC PANEL
ALT: 15 U/L (ref 0–44)
AST: 33 U/L (ref 15–41)
Albumin: 3.7 g/dL (ref 3.5–5.0)
Alkaline Phosphatase: 61 U/L (ref 38–126)
Anion gap: 12 (ref 5–15)
BUN: 16 mg/dL (ref 8–23)
CO2: 21 mmol/L — ABNORMAL LOW (ref 22–32)
Calcium: 8.8 mg/dL — ABNORMAL LOW (ref 8.9–10.3)
Chloride: 100 mmol/L (ref 98–111)
Creatinine, Ser: 0.89 mg/dL (ref 0.44–1.00)
GFR, Estimated: >60 mL/min (ref >60)
Glucose, Bld: 152 mg/dL — ABNORMAL HIGH (ref 70–99)
Potassium: 4.6 mmol/L (ref 3.5–5.1)
Sodium: 133 mmol/L — ABNORMAL LOW (ref 135–145)
Total Bilirubin: 0.7 mg/dL (ref 0.3–1.2)
Total Protein: 6.4 g/dL — ABNORMAL LOW (ref 6.5–8.1)

## 2021-05-22 LAB — CUP PACEART REMOTE DEVICE CHECK
Battery Remaining Longevity: 60 mo
Battery Remaining Percentage: 96 %
Brady Statistic RA Percent Paced: 31 %
Brady Statistic RV Percent Paced: 0 %
Date Time Interrogation Session: 20221011023200
Implantable Lead Implant Date: 20100415
Implantable Lead Implant Date: 20100415
Implantable Lead Location: 753859
Implantable Lead Location: 753860
Implantable Lead Model: 5076
Implantable Lead Model: 5076
Implantable Pulse Generator Implant Date: 20180416
Lead Channel Impedance Value: 555 Ohm
Lead Channel Impedance Value: 579 Ohm
Lead Channel Pacing Threshold Amplitude: 0.4 V
Lead Channel Pacing Threshold Amplitude: 1.2 V
Lead Channel Pacing Threshold Pulse Width: 0.4 ms
Lead Channel Pacing Threshold Pulse Width: 0.4 ms
Lead Channel Setting Pacing Amplitude: 2 V
Lead Channel Setting Pacing Amplitude: 2.5 V
Lead Channel Setting Pacing Pulse Width: 0.4 ms
Lead Channel Setting Sensing Sensitivity: 2.5 mV
Pulse Gen Serial Number: 765776

## 2021-05-22 LAB — URINALYSIS, MICROSCOPIC (REFLEX)

## 2021-05-22 LAB — URINALYSIS, ROUTINE W REFLEX MICROSCOPIC
Bilirubin Urine: NEGATIVE
Glucose, UA: NEGATIVE mg/dL
Ketones, ur: NEGATIVE mg/dL
Leukocytes,Ua: NEGATIVE
Nitrite: NEGATIVE
Protein, ur: NEGATIVE mg/dL
Specific Gravity, Urine: 1.015 (ref 1.005–1.030)
pH: 6 (ref 5.0–8.0)

## 2021-05-22 LAB — RESP PANEL BY RT-PCR (FLU A&B, COVID) ARPGX2
Influenza A by PCR: NEGATIVE
Influenza B by PCR: NEGATIVE
SARS Coronavirus 2 by RT PCR: NEGATIVE

## 2021-05-22 LAB — TROPONIN I (HIGH SENSITIVITY)
Troponin I (High Sensitivity): 2462 ng/L (ref <18)
Troponin I (High Sensitivity): 2487 ng/L (ref <18)

## 2021-05-22 MED ORDER — ASPIRIN EC 325 MG PO TBEC
325.0000 mg | DELAYED_RELEASE_TABLET | Freq: Once | ORAL | Status: AC
  Administered 2021-05-22: 325 mg via ORAL
  Filled 2021-05-22: qty 1

## 2021-05-22 NOTE — ED Notes (Signed)
Pt at radiology at this time. Unable to reassess vitals.

## 2021-05-22 NOTE — Consult Note (Addendum)
Cardiology Consultation:   [name] MRN: [id number]; DOB: [birth year]  Admit date: 05/22/2021 Date of Consult: 05/22/2021  PCP:  Lajean Manes, MD   [facility] Providers Cardiologist/Electrophysiologist  Dr. Rayann Heman   Patient Profile:   Sara Perkins is a 85 y.o. female with a hx of NICM with improved LVEF, TIA, demenita  and hx of syncope s/p dual chamber PPM in 2010 who is being seen 05/22/2021 for the evaluation of elevated troponin at the request of Dr. Langston Masker.  Moved to [place] from [place] after death of her husband to be near to her son and establish care with Dr. Rayann Heman January 2020.    Cath 05/17/2016  with normal coronaries but LVEF 41-49% >>>suspected 2/2 RV pacing.  Echocardiogram February 2021 showed improved LV function to 60 to 65%, no wall motion abnormality, grade 2 diastolic dysfunction.  Severely dilated left atrium.  History of Present Illness:   Sara Perkins presents to [facility] emergency room via EMS from [facility] assisted living facility after syncope episode.  Patient has baseline dementia.  History obtained from reviewing chart and daughter-in-law at bedside.  Patient does not remember events last fall.  Per daughter-in-law, they had family dinner Sunday night.  Patient gets medication from nurse at facility 3 times per day.  They got a call this morning around 1130 notifying fall.  Patient had head laceration on back. Patient denies chest pain, shortness of breath, palpitations but hx is unreliable due to dementia.   Hs-troponin 2462 >>2487 K 4.6 Scr normal CT of head without acute intracranial abnormality.    Past Medical History:  Diagnosis Date   Broken foot 2017   Chicken pox    Syncope    Related to BP medication    TIA (transient ischemic attack)    UTI (urinary tract infection)     Past Surgical History:  Procedure Laterality Date   ABDOMINAL HYSTERECTOMY     APPENDECTOMY     PACEMAKER GENERATOR CHANGE   11/25/2016   [employer] L [facility] generator change performed in [facility] in [place]  12/24/2008   MDT PPM implanted by Dr Joeseph Amor in [place]     Inpatient Medications: Scheduled Meds: REM  Continuous Infusions: REM PRN Meds:   Allergies:   No Known Allergies  Social History:   Social History   Socioeconomic History   Marital status: Unknown    Spouse name: Not on file   Number of children: Not on file   Years of education: Not on file   Highest education level: Not on file  Occupational History   Not on file  Tobacco Use   Smoking status: Never   Smokeless tobacco: Never  Vaping Use   Vaping Use: Never used  Substance and Sexual Activity   Alcohol use: Never   Drug use: Never   Sexual activity: Not on file  Other Topics Concern   Not on file  Social History Narrative   Lives in [place] with son   Widowed   Retired from Scientist, research (life sciences) estate   Social Determinants of Radio broadcast assistant Strain: Not on file  Food Insecurity: Not on file  Transportation Needs: Not on file  Physical Activity: Not on file  Stress: Not on file  Social Connections: Not on file  Intimate Partner Violence: Not on file    Family History:    Family History  Problem Relation Age of Onset   Kidney disease Father  ROS:  Please see the history of present illness.  All other ROS reviewed and negative.     Physical Exam/Data:   Vitals:   05/22/21 1345 05/22/21 1423 05/22/21 1529  BP: (!) 138/53  (!) 153/64  Pulse: 71  72  Resp: 16  16  Temp: 98.5 F (36.9 C)    TempSrc: Oral    SpO2: 100%  100%  Weight:  49.9 kg   Height:  5\' 6"  (1.676 m)    No intake or output data in the 24 hours ending 05/22/21 1725 Last 3 Weights 05/22/2021 09/21/2019 08/19/2018  Weight (lbs) 110 lb 122 lb 125 lb  Weight (kg) 49.896 kg 55.339 kg 56.7 kg     Body mass index is 17.75 kg/m.  General:  Well nourished, well developed, in no acute distress HEENT: head  trauma Neck: no JVD Vascular: No carotid bruits; Distal pulses 2+ bilaterally Cardiac:  normal S1, S2; RRR; no murmur  Lungs:  clear to auscultation bilaterally, no wheezing, rhonchi or rales  Abd: soft, nontender, no hepatomegaly  Ext: no edema Musculoskeletal:  No deformities, BUE and BLE strength normal and equal Skin: warm and dry  Neuro: no focal abnormalities noted, only oriented to name Psych: dementia  EKG:  The EKG was personally reviewed and demonstrates:  Sinus rhythm with chronic LBBB Telemetry:  Telemetry was personally reviewed and demonstrates:  NSR  Relevant CV Studies:  Echo 10/05/2019  1. Left ventricular ejection fraction, by estimation, is 60 to 65%. The  left ventricle has normal function. The left ventricle has no regional  wall motion abnormalities. Left ventricular diastolic parameters are  consistent with Grade II diastolic  dysfunction (pseudonormalization). Elevated left ventricular end-diastolic  pressure. The average left ventricular global longitudinal strain is -17.1  %.   2. Left atrial size was severely dilated.   3. Right ventricular systolic function is normal. The right ventricular  size is normal. There is normal pulmonary artery systolic pressure.   4. The mitral valve is normal in structure and function. Moderate mitral  valve regurgitation. No evidence of mitral stenosis.   5. The aortic valve is tricuspid. Aortic valve regurgitation is moderate.  Moderate aortic valve sclerosis/calcification is present, without any  evidence of aortic stenosis.   6. Aortic dilatation noted. There is mild dilatation of the ascending  aorta measuring 40 mm.   7. The inferior vena cava is normal in size with greater than 50%  respiratory variability, suggesting right atrial pressure of 3 mmHg.   Laboratory Data:  High Sensitivity Troponin:   Recent Labs  Lab 05/22/21 1411 05/22/21 1542  TROPONINIHS 2,462* 2,487*     Chemistry Recent Labs  Lab  05/22/21 1411  NA 133*  K 4.6  CL 100  CO2 21*  GLUCOSE 152*  BUN 16  CREATININE 0.89  CALCIUM 8.8*  GFRNONAA >60  ANIONGAP 12    Recent Labs  Lab 05/22/21 1411  PROT 6.4*  ALBUMIN 3.7  AST 33  ALT 15  ALKPHOS 61  BILITOT 0.7   Hematology Recent Labs  Lab 05/22/21 1411  WBC 6.2  RBC 4.54  HGB 13.9  HCT 43.5  MCV 95.8  MCH 30.6  MCHC 32.0  RDW 13.5  PLT 204   Radiology/Studies:  CT Head Wo Contrast  Result Date: 05/22/2021 CLINICAL DATA:  Head trauma, mod-severe.  Fall. EXAM: CT HEAD WITHOUT CONTRAST TECHNIQUE: Contiguous axial images were obtained from the base of the skull through the vertex without intravenous contrast.  COMPARISON:  None. FINDINGS: Brain: There is atrophy and chronic small vessel disease changes. No acute intracranial abnormality. Specifically, no hemorrhage, hydrocephalus, mass lesion, acute infarction, or significant intracranial injury. Vascular: No hyperdense vessel or unexpected calcification. Skull: No acute calvarial abnormality. Sinuses/Orbits: No acute findings Other: None IMPRESSION: Atrophy, chronic microvascular disease. No acute intracranial abnormality. Electronically Signed   By: Rolm Baptise M.D.   On: 05/22/2021 15:39   CT Cervical Spine Wo Contrast  Result Date: 05/22/2021 CLINICAL DATA:  Fall.  Neck trauma (Age >= 65y) EXAM: CT CERVICAL SPINE WITHOUT CONTRAST TECHNIQUE: Multidetector CT imaging of the cervical spine was performed without intravenous contrast. Multiplanar CT image reconstructions were also generated. COMPARISON:  None. FINDINGS: Alignment: Slight degenerative anterolisthesis of C3 on C4. Skull base and vertebrae: No acute fracture. No primary bone lesion or focal pathologic process. Soft tissues and spinal canal: No prevertebral fluid or swelling. No visible canal hematoma. Disc levels: Diffuse degenerative disc disease and facet disease. Disc disease is most pronounced from C4-5 through C6-7. Facet disease is most  pronounced on the left. Upper chest: Biapical scarring.  No acute findings Other: None IMPRESSION: Degenerative disc and facet disease.  No acute bony abnormality. Electronically Signed   By: Rolm Baptise M.D.   On: 05/22/2021 15:40   CUP PACEART REMOTE DEVICE CHECK  Result Date: 05/22/2021 Scheduled remote reviewed. Normal device function.  2 PAT (short), 1 NSVT, 24 beats on 02/24/21. Routing for further review per protocol Next remote 91 days- JBox, RN/CVRS    Assessment and Plan:   Elevated troponin  - Hs-troponin 2462 >>2487. Asymptomatic but has dementia. EKG without acute ischemic changes.  Could be demand ischemia in setting of fall/trauma.  Patient had normal coronaries by cardiac catheterization in 2017.  Echocardiogram in 2021 with normal LV function. -No need to trend troponin -Would avoid heparin currently.  MD to see.   2. NICM - Echocardiogram February 2021 showed improved LV function to 60 to 65%, no wall motion abnormality, grade 2 diastolic dysfunction.  Severely dilated left atrium.  3. Fall/Head trauma - Work up per primary team  4. Dementia  Risk Assessment/Risk Scores:  [id number]}   TIMI Risk Score for Unstable Angina or Non-ST Elevation MI: The patient's TIMI risk score is 2, which indicates a 8% risk of all cause mortality, new or recurrent myocardial infarction or need for urgent revascularization in the next 14 days.{   For questions or updates, please contact [place] Please consult [email or web address] for contact info under    Jarrett Soho, [place]  05/22/2021 5:25 PM   History and all data above reviewed.  Patient examined.  I agree with the findings as above.   The patient is pleasant and in no distress.  She has very poor memory and does not recall the events.  She was found with a head laceration but there was no clear syncope.  Talking to her daughter-in-law patient has not had any recent cardiac issues or complaints that they know of.  She  does not complain of chest pain or shortness of breath.  She walks to the dining hall unassisted.  There have been no near syncope episodes or any evidence of orthostasis.  She otherwise been in her state of usual health.  She was found with head laceration and there was no witnessed fall or syncope.  EKG is the patient exam reveals sinus rhythm with left bundle branch block.   Troponins were elevated as above but flat.  COR: Regular rate and rhythm no murmurs,  Lungs: Clear to auscultation bilaterally,  Abd: Positive bowel sounds normal in frequency and pitch, Ext no edema.  All available labs, radiology testing, previous records reviewed. Agree with documented assessment and plan.   Elevated troponin: This is out of context to clinical situation.  Is not clear that she had syncope.  She does not recall any symptoms.  At this point I will check orthostatics.  I will check an echocardiogram.  If these are unremarkable then I would not suggest further work-up.  I had this discussion with the daughter and they agree with conservative measures.  Sara Perkins  6:26 PM  05/22/2021

## 2021-05-22 NOTE — ED Triage Notes (Signed)
Patient brought in via ems from [facility] assisted [facility]. Patient suffered a fall, unknown time or day, last seen was 3 days ago. Patient does not remember falling. Is on plavix. VSS

## 2021-05-22 NOTE — ED Provider Notes (Addendum)
[facility] EMERGENCY DEPARTMENT Provider Note   CSN: [id number] Arrival date & time: 05/22/21  1342     History No chief complaint on file.  Level 5 caveat Sara Perkins is a 85 y.o. female.  With past medical history of TIA, memory loss, dual-chamber pacemaker placement for sick sinus syndrome who presents emergency department with fall.  History obtained primarily from EMS as the patient has dementia.  She is from [facility] assisted living.  States that the report given to him was that she fell at some point over the last 3 days.  There is an unknown time or day that she fell.  Per report they found her with blood in her hair.  She is anticoagulated on Plavix.  They states she is at her baseline mental status.  On interview with the patient I asked if she had fallen.  She states that she had.  I asked if she tripped over anything and she states "I do not know I was too busy falling."  She does state that she hit her head and it is sore.  She denies pain to her neck, extremities, chest or abdomen.  I have tried to obtain whether she had chest pain, lightheadedness or palpitations prior to falling and she is unable to answer these questions.  She states she "does not know but she does not think so."  HPI     Past Medical History:  Diagnosis Date   Broken foot 2017   Chicken pox    Syncope    Related to BP medication    TIA (transient ischemic attack)    UTI (urinary tract infection)     Patient Active Problem List   Diagnosis Date Noted   Pacemaker 04/27/2018   Memory change 01/28/2018   Adjustment disorder with depressed mood 01/28/2018   TIA (transient ischemic attack) 01/28/2018    Past Surgical History:  Procedure Laterality Date   ABDOMINAL HYSTERECTOMY     APPENDECTOMY     PACEMAKER GENERATOR CHANGE  11/25/2016   [employer] generator change performed in [facility] in [place]  12/24/2008   MDT PPM  implanted by Dr Joeseph Amor in [place]      OB History   No obstetric history on file.     Family History  Problem Relation Age of Onset   Kidney disease Father     Social History   Tobacco Use   Smoking status: Never   Smokeless tobacco: Never  Vaping Use   Vaping Use: Never used  Substance Use Topics   Alcohol use: Never   Drug use: Never    Home Medications Prior to Admission medications   Medication Sig Start Date End Date Taking? Authorizing Provider  atorvastatin (LIPITOR) 10 MG tablet TAKE 1 TABLET BY MOUTH  DAILY 05/25/19   Luetta Nutting, DO  clopidogrel (PLAVIX) 75 MG tablet TAKE 1 TABLET BY MOUTH  DAILY 02/04/19   Luetta Nutting, DO  furosemide (LASIX) 40 MG tablet Take 1 tablet (40 mg total) by mouth daily. 11/23/18   Luetta Nutting, DO  Glucosamine-Chondroit-Vit C-Mn (GLUCOSAMINE 1500 COMPLEX PO) Take by mouth.    [provider]  metoprolol succinate (TOPROL XL) 25 MG 24 hr tablet Take 1 tablet (25 mg total) by mouth daily. 10/06/19   Baldwin Jamaica, PA-C  potassium chloride SA (K-DUR,KLOR-CON) 20 MEQ tablet Take 1 tablet (20 mEq total) by mouth daily. 11/23/18   Luetta Nutting, DO  VITAMIN D PO Take 600 mg by mouth daily.    [provider]    Allergies    Patient has no known allergies.  Review of Systems   Review of Systems  Musculoskeletal:  Positive for gait problem.  Skin:  Positive for wound.  Psychiatric/Behavioral:  Positive for confusion.   All other systems reviewed and are negative.  Physical Exam Updated Vital Signs BP (!) 138/53 (BP Location: Left Arm)   Pulse 71   Temp 98.5 F (36.9 C) (Oral)   Resp 16   SpO2 100%   Physical Exam Vitals and nursing note reviewed.  Constitutional:      General: She is not in acute distress.    Appearance: Normal appearance. She is not toxic-appearing.  HENT:     Head: Normocephalic.     Comments: Small skin tear to the back left of the head with 2 to 3 cm area of dried blood.  Her hair  is also red from blood.  Attempt to look through the rest of her hair to her scalp and do not see any other areas of injury.    Right Ear: External ear normal.     Left Ear: External ear normal.     Nose: Nose normal.     Mouth/Throat:     Mouth: Mucous membranes are moist.     Pharynx: Oropharynx is clear.  Eyes:     General: No scleral icterus.    Extraocular Movements: Extraocular movements intact.     Conjunctiva/sclera: Conjunctivae normal.     Pupils: Pupils are equal, round, and reactive to light.  Cardiovascular:     Rate and Rhythm: Normal rate and regular rhythm.     Pulses: Normal pulses.     Heart sounds: Normal heart sounds. No murmur heard. Pulmonary:     Effort: Pulmonary effort is normal. No respiratory distress.     Breath sounds: Normal breath sounds.  Abdominal:     General: Bowel sounds are normal.     Palpations: Abdomen is soft.     Tenderness: There is no abdominal tenderness.     Comments: No bruising or injuries to the abdomen  Musculoskeletal:        General: No swelling, tenderness, deformity or signs of injury. Normal range of motion.     Cervical back: Normal range of motion and neck supple. No tenderness.     Right lower leg: No edema.     Left lower leg: No edema.  Skin:    General: Skin is warm and dry.     Capillary Refill: Capillary refill takes less than 2 seconds.     Findings: No bruising.  Neurological:     General: No focal deficit present.     Mental Status: She is alert. Mental status is at baseline.     Cranial Nerves: No cranial nerve deficit.     Motor: No weakness.     Coordination: Coordination normal.     Gait: Gait normal.  Psychiatric:        Attention and Perception: Attention normal. She does not perceive auditory or visual hallucinations.        Mood and Affect: Mood normal.        Speech: Speech normal.        Behavior: Behavior normal. Behavior is cooperative.        Cognition and Memory: Cognition is impaired. Memory  is impaired.    ED Results / Procedures / Treatments  Labs (all labs ordered are listed, but only abnormal results are displayed) Labs Reviewed  URINALYSIS, ROUTINE W REFLEX MICROSCOPIC - Abnormal; Notable for the following components:      Result Value   Hgb urine dipstick TRACE (*)    All other components within normal limits  COMPREHENSIVE METABOLIC PANEL - Abnormal; Notable for the following components:   Sodium 133 (*)    CO2 21 (*)    Glucose, Bld 152 (*)    Calcium 8.8 (*)    Total Protein 6.4 (*)    All other components within normal limits  CBC WITH DIFFERENTIAL/PLATELET - Abnormal; Notable for the following components:   nRBC 1.0 (*)    All other components within normal limits  URINALYSIS, MICROSCOPIC (REFLEX) - Abnormal; Notable for the following components:   Bacteria, UA RARE (*)    All other components within normal limits  TROPONIN I (HIGH SENSITIVITY) - Abnormal; Notable for the following components:   Troponin I (High Sensitivity) 2,462 (*)    All other components within normal limits  TROPONIN I (HIGH SENSITIVITY) - Abnormal; Notable for the following components:   Troponin I (High Sensitivity) 2,487 (*)    All other components within normal limits  RESP PANEL BY RT-PCR (FLU A&B, COVID) [id number]   EKG EKG Interpretation  Date/Time:  Tuesday May 22 2021 14:28:25 EDT Ventricular Rate:  68 PR Interval:  159 QRS Duration: 146 QT Interval:  460 QTC Calculation: 490 R Axis:   -61 Text Interpretation: Sinus rhythm Left bundle branch block Confirmed by Octaviano Glow [phone]) on 05/22/2021 2:46:11 PM  Radiology CT Head Wo Contrast  Result Date: 05/22/2021 CLINICAL DATA:  Head trauma, mod-severe.  Fall. EXAM: CT HEAD WITHOUT CONTRAST TECHNIQUE: Contiguous axial images were obtained from the base of the skull through the vertex without intravenous contrast. COMPARISON:  None. FINDINGS: Brain: There is atrophy and chronic small vessel disease changes. No  acute intracranial abnormality. Specifically, no hemorrhage, hydrocephalus, mass lesion, acute infarction, or significant intracranial injury. Vascular: No hyperdense vessel or unexpected calcification. Skull: No acute calvarial abnormality. Sinuses/Orbits: No acute findings Other: None IMPRESSION: Atrophy, chronic microvascular disease. No acute intracranial abnormality. Electronically Signed   By: Rolm Baptise M.D.   On: 05/22/2021 15:39   CT Cervical Spine Wo Contrast  Result Date: 05/22/2021 CLINICAL DATA:  Fall.  Neck trauma (Age >= 65y) EXAM: CT CERVICAL SPINE WITHOUT CONTRAST TECHNIQUE: Multidetector CT imaging of the cervical spine was performed without intravenous contrast. Multiplanar CT image reconstructions were also generated. COMPARISON:  None. FINDINGS: Alignment: Slight degenerative anterolisthesis of C3 on C4. Skull base and vertebrae: No acute fracture. No primary bone lesion or focal pathologic process. Soft tissues and spinal canal: No prevertebral fluid or swelling. No visible canal hematoma. Disc levels: Diffuse degenerative disc disease and facet disease. Disc disease is most pronounced from C4-5 through C6-7. Facet disease is most pronounced on the left. Upper chest: Biapical scarring.  No acute findings Other: None IMPRESSION: Degenerative disc and facet disease.  No acute bony abnormality. Electronically Signed   By: Rolm Baptise M.D.   On: 05/22/2021 15:40   CUP PACEART REMOTE DEVICE CHECK  Result Date: 05/22/2021 Scheduled remote reviewed. Normal device function.  2 PAT (short), 1 NSVT, 24 beats on 02/24/21. Routing for further review per protocol Next remote 91 days- JBox, RN/CVRS   Procedures .Critical Care Performed by: Mickie Hillier, PA-C Authorized by: Mickie Hillier, PA-C   Critical care provider statement:  Critical care time (minutes):  35   Critical care time was exclusive of:  Separately billable procedures and treating other patients and teaching time    Critical care was necessary to treat or prevent imminent or life-threatening deterioration of the following conditions:  Cardiac failure (NSTEMI)   Critical care was time spent personally by me on the following activities:  Development of treatment plan with patient or surrogate, discussions with consultants, examination of patient, obtaining history from patient or surrogate, review of old charts, re-evaluation of patient's condition, pulse oximetry, ordering and review of laboratory studies and ordering and review of radiographic studies   I assumed direction of critical care for this patient from another provider in my specialty: no     Care discussed with: admitting provider     Medications Ordered in ED Medications  aspirin EC tablet 325 mg (325 mg Oral Given 05/22/21 1636)    ED Course  I have reviewed the triage vital signs and the nursing notes.  Pertinent labs & imaging results that were available during my care of the patient were reviewed by me and considered in my medical decision making (see chart for details).  Clinical Course as of 05/22/21 1726  Tue May 22, 2021  1440 Pt presenting as an unwitnessed fall on plavix, presenting with head injury, hx of pacemaker as well.  Pt is poor historian, cannot provide hx, as she has alzheimer's dementia.  Pt needing labs, [facility] [MT]  1620 NSTEMI - cards to be consulted, will start heparin [MT]    Clinical Course User Index [MT] Trifan, Carola Rhine, MD   Spoke with Currie Paris): States that patient's memory all depends on the day.  She has been roaming around the assisted living facility more frequently and has been having difficulty finding her way back to her room.  He is unaware of any previous falls that she may have had.  I have updated him on imaging results and that she will need the rest of her blood work done before a medical decision is made about whether she will be discharged back to her facility or admitted to the  hospital.  1609: Troponin >2,000 - repeat pending  1625: spoke with Cardiology who will follow pt. Will consult Hospitalist for admission  1654: Spoke with Dr. Sloan Leiter, hospitalist, who agrees to admission at this time.  MDM Rules/Calculators/A&P 85 year old female who presents emergency department after unwitnessed fall. Initially unclear etiology for fall and unwitnessed event so no bystander history. Obtained initial broad labs and head CT and CT C-spine given that she has obvious skin tear and bleeding from the back of her head. CT head and C-spine negative for acute intracranial hemorrhages or other abnormalities. EKG with left bundle branch block with possible underlying pacing. Initial troponin 2462 with repeat 2487.  Ordered 325mg  of aspirin.  Reevaluated the patient who continues to have no chest pain.  At time of arrival I have requested from the nurse to have her pacemaker interrogated for any defibrillations to explain the elevated troponin, otherwise we will treat as NSTEMI at this time.   Spoke with cardiology who is willing to follow the patient with hospitalist admission.  They advised against heparinizing her at this time. Spoke with hospitalist who agrees for admission.  I discussed this case with my attending physician, Dr. Langston Masker who cosigned this note including patient's presenting symptoms, physical exam, and planned diagnostics and interventions. Attending physician stated agreement with plan or made changes to plan which were  implemented.   Attending physician assessed patient at bedside.  Final Clinical Impression(s) / ED Diagnoses Final diagnoses:  NSTEMI (non-ST elevated myocardial infarction) [facility])    Rx / DC Orders ED Discharge Orders     None        Mickie Hillier, PA-C 05/22/21 1726    Mickie Hillier, PA-C 05/22/21 1727    Wyvonnia Dusky, MD 05/22/21 1820

## 2021-05-22 NOTE — H&P (Signed)
History and Physical    [name] FHL:[id number] DOB: [birth year] DOA: 05/22/2021  PCP: Lajean Manes, MD  Patient coming from: Assisted living facility,  I have personally briefly reviewed patient's old medical records available.   Chief Complaint: [facility] at facility  HPI: Sara Perkins is a 85 y.o. female with medical history significant of sick sinus syndrome status post pacemaker, TIA, dementia brought to the ER with fall.  Patient is poor historian.  She is pleasant, does not endorse any current complaints.  She lives in assisted living place.  She was noted to be walking around with blood on her hair , so assisted-living staff were alerted and sent her to the ER.  Patient unable to recall whether she had any syncopal episode or mechanical fall.  She tells me that she cannot recall as she might have fallen many times in her lifetime. Denies any chest pain or palpitations.  Denies any shortness of breath.  Denies any syncopal episodes. She does have a history of sick sinus syndrome and has pacemaker.  Does not have any history of coronary artery disease.  She is on Plavix with history of a stroke. ED Course: Hemodynamically stable.  Skeletal survey normal.  Troponins more than 2000 with normal renal functions.  EKG nonischemic.  Aspirin 324 mg given.  Discussed with cardiology and advised admission to the hospital.  Review of Systems: all systems are reviewed and pertinent positive as per HPI otherwise rest are negative.    Past Medical History:  Diagnosis Date   Broken foot 2017   Chicken pox    Syncope    Related to BP medication    TIA (transient ischemic attack)    UTI (urinary tract infection)     Past Surgical History:  Procedure Laterality Date   ABDOMINAL HYSTERECTOMY     APPENDECTOMY     PACEMAKER GENERATOR CHANGE  11/25/2016   [employer] [facility] generator change performed in [facility] in [place]  12/24/2008   MDT PPM  implanted by Dr Joeseph Amor in [place] history   reports that she has never smoked. She has never used smokeless tobacco. She reports that she does not drink alcohol and does not use drugs.  No Known Allergies  Family History  Problem Relation Age of Onset   Kidney disease Father      Prior to Admission medications   Medication Sig Start Date End Date Taking? Authorizing Provider  alendronate (FOSAMAX) 70 MG tablet Take 70 mg by mouth once a week. Take on Fridays 04/17/21  Yes [provider]  atorvastatin (LIPITOR) 10 MG tablet TAKE 1 TABLET BY MOUTH  DAILY Patient taking differently: Take 10 mg by mouth daily. 05/25/19  Yes Luetta Nutting, DO  calcium-vitamin D (OSCAL WITH D) 500-200 MG-UNIT tablet Take 1 tablet by mouth every evening. With dinner   Yes [provider]  clopidogrel (PLAVIX) 75 MG tablet TAKE 1 TABLET BY MOUTH  DAILY Patient taking differently: Take 75 mg by mouth daily. 02/04/19  Yes Luetta Nutting, DO  divalproex (DEPAKOTE SPRINKLE) 125 MG capsule Take 125 mg by mouth daily as needed. 05/22/21  Yes [provider]  donepezil (ARICEPT) 10 MG tablet Take 10 mg by mouth daily. 05/22/21  Yes [provider]  furosemide (LASIX) 40 MG tablet Take 1 tablet (40 mg total) by mouth daily. 11/23/18  Yes Luetta Nutting, DO  melatonin 5 MG TABS Take 5 mg by mouth  at bedtime.   Yes [provider]  memantine (NAMENDA) 10 MG tablet Take 10 mg by mouth 2 (two) times daily. 05/22/21  Yes [provider]  Multiple Vitamins-Minerals (CENTRUM ADULTS) TABS Take 1 tablet by mouth daily.   Yes [provider]  Potassium Chloride ER 20 MEQ TBCR Take 1 tablet by mouth daily. 12/22/20  Yes [provider]  potassium chloride SA (K-DUR,KLOR-CON) 20 MEQ tablet Take 1 tablet (20 mEq total) by mouth daily. 11/23/18  Yes Luetta Nutting, DO  vitamin B-12 (CYANOCOBALAMIN) 1000 MCG tablet Take 1,000 mcg by mouth in the morning and at  bedtime.   Yes [provider]  VITAMIN D PO Take 600 mg by mouth daily.   Yes [provider]  metoprolol succinate (TOPROL XL) 25 MG 24 hr tablet Take 1 tablet (25 mg total) by mouth daily. Patient not taking: Reported on 05/22/2021 10/06/19   Baldwin [place], PA-C    Physical Exam: Vitals:   05/22/21 1345 05/22/21 1423 05/22/21 1529  BP: (!) 138/53  (!) 153/64  Pulse: 71  72  Resp: 16  16  Temp: 98.5 F (36.9 C)    TempSrc: Oral    SpO2: 100%  100%  Weight:  49.9 kg   Height:  5\' 6"  (1.676 m)     Constitutional: NAD, calm, comfortable Vitals:   05/22/21 1345 05/22/21 1423 05/22/21 1529  BP: (!) 138/53  (!) 153/64  Pulse: 71  72  Resp: 16  16  Temp: 98.5 F (36.9 C)    TempSrc: Oral    SpO2: 100%  100%  Weight:  49.9 kg   Height:  5\' 6"  (1.676 m)    Eyes: PERRL, lids and conjunctivae normal ENMT: Mucous membranes are moist. Posterior pharynx clear of any exudate or lesions.Normal dentition.  Neck: normal, supple, no masses, no thyromegaly Respiratory: clear to auscultation bilaterally, no wheezing, no crackles. Normal respiratory effort. No accessory muscle use.  Cardiovascular: Regular rate and rhythm, no murmurs / rubs / gallops. No extremity edema. 2+ pedal pulses. No carotid bruits.  Pacemaker present on left precordium.  Nontender. Abdomen: no tenderness, no masses palpated. No hepatosplenomegaly. Bowel sounds positive.  Musculoskeletal: no clubbing / cyanosis. No joint deformity upper and lower extremities. Good ROM, no contractures. Normal muscle tone.  Skin: no rashes, lesions, ulcers. No induration Neurologic: CN 2-12 grossly intact. Sensation intact, DTR normal. Strength 5/5 in all 4.  Psychiatric: Pleasant but confused.  She is well interactive.  She is alert and awake but oriented to her relatives.  Not oriented to time and place.    Labs on Admission: I have personally reviewed following labs and imaging studies  CBC: Recent Labs   Lab 05/22/21 1411  WBC 6.2  NEUTROABS 4.4  HGB 13.9  HCT 43.5  MCV 95.8  PLT 564   Basic Metabolic Panel: Recent Labs  Lab 05/22/21 1411  NA 133*  K 4.6  CL 100  CO2 21*  GLUCOSE 152*  BUN 16  CREATININE 0.89  CALCIUM 8.8*   GFR: Estimated Creatinine Clearance: 38.4 mL/min (by C-G formula based on SCr of 0.89 mg/dL). Liver Function Tests: Recent Labs  Lab 05/22/21 1411  AST 33  ALT 15  ALKPHOS 61  BILITOT 0.7  PROT 6.4*  ALBUMIN 3.7   No results for input(s): LIPASE, AMYLASE in the last 168 hours. No results for input(s): AMMONIA in the last 168 hours. Coagulation Profile: No results for input(s): INR, PROTIME in the  last 168 hours. Cardiac Enzymes: No results for input(s): CKTOTAL, CKMB, CKMBINDEX, TROPONINI in the last 168 hours. BNP (last 3 results) No results for input(s): PROBNP in the last 8760 hours. HbA1C: No results for input(s): HGBA1C in the last 72 hours. CBG: No results for input(s): GLUCAP in the last 168 hours. Lipid Profile: No results for input(s): CHOL, HDL, LDLCALC, TRIG, CHOLHDL, LDLDIRECT in the last 72 hours. Thyroid Function Tests: No results for input(s): TSH, T4TOTAL, FREET4, T3FREE, THYROIDAB in the last 72 hours. Anemia Panel: No results for input(s): VITAMINB12, FOLATE, FERRITIN, TIBC, IRON, RETICCTPCT in the last 72 hours. Urine analysis:    Component Value Date/Time   COLORURINE YELLOW 05/22/2021 1600   APPEARANCEUR CLEAR 05/22/2021 1600   LABSPEC 1.015 05/22/2021 1600   PHURINE 6.0 05/22/2021 1600   GLUCOSEU NEGATIVE 05/22/2021 1600   HGBUR TRACE (A) 05/22/2021 1600   BILIRUBINUR NEGATIVE 05/22/2021 1600   KETONESUR NEGATIVE 05/22/2021 1600   PROTEINUR NEGATIVE 05/22/2021 1600   NITRITE NEGATIVE 05/22/2021 1600   LEUKOCYTESUR NEGATIVE 05/22/2021 1600    Radiological Exams on Admission: CT Head Wo Contrast  Result Date: 05/22/2021 CLINICAL DATA:  Head trauma, mod-severe.  Fall. EXAM: CT HEAD WITHOUT CONTRAST  TECHNIQUE: Contiguous axial images were obtained from the base of the skull through the vertex without intravenous contrast. COMPARISON:  None. FINDINGS: Brain: There is atrophy and chronic small vessel disease changes. No acute intracranial abnormality. Specifically, no hemorrhage, hydrocephalus, mass lesion, acute infarction, or significant intracranial injury. Vascular: No hyperdense vessel or unexpected calcification. Skull: No acute calvarial abnormality. Sinuses/Orbits: No acute findings Other: None IMPRESSION: Atrophy, chronic microvascular disease. No acute intracranial abnormality. Electronically Signed   By: Rolm Baptise M.D.   On: 05/22/2021 15:39   CT Cervical Spine Wo Contrast  Result Date: 05/22/2021 CLINICAL DATA:  Fall.  Neck trauma (Age >= 65y) EXAM: CT CERVICAL SPINE WITHOUT CONTRAST TECHNIQUE: Multidetector CT imaging of the cervical spine was performed without intravenous contrast. Multiplanar CT image reconstructions were also generated. COMPARISON:  None. FINDINGS: Alignment: Slight degenerative anterolisthesis of C3 on C4. Skull base and vertebrae: No acute fracture. No primary bone lesion or focal pathologic process. Soft tissues and spinal canal: No prevertebral fluid or swelling. No visible canal hematoma. Disc levels: Diffuse degenerative disc disease and facet disease. Disc disease is most pronounced from C4-5 through C6-7. Facet disease is most pronounced on the left. Upper chest: Biapical scarring.  No acute findings Other: None IMPRESSION: Degenerative disc and facet disease.  No acute bony abnormality. Electronically Signed   By: Rolm Baptise M.D.   On: 05/22/2021 15:40   CUP PACEART REMOTE DEVICE CHECK  Result Date: 05/22/2021 Scheduled remote reviewed. Normal device function.  2 PAT (short), 1 NSVT, 24 beats on 02/24/21. Routing for further review per protocol Next remote 91 days- JBox, RN/CVRS   EKG: Independently reviewed.  Normal sinus rhythm.  No ST or T wave  changes.  Assessment/Plan Principal Problem:   NSTEMI (non-ST elevated myocardial infarction) [facility]) Active Problems:   Pacemaker   Fall at home, initial encounter     1.  Non-STEMI versus demand ischemia: Significantly elevated troponins with no evidence of EKG changes or clinical symptoms of acute coronary syndrome.  Likely demand ischemia.  With her age, comorbidities, dementia may likely treat conservatively. We will admit patient to the telemetry unit given severity of symptoms and biochemical changes. Currently chest pain improved. Cycle EKG and troponins. Supplemental oxygen to keep saturations more than 90%. Aspirin, given  in the ER.  Resume her home dose of Plavix. Nitroglycerin, for recurrent chest pain. Morphine for severe and recurrent pain. Therapeutic anticoagulation not indicated because of not having symptoms. Cardiology to follow. Already on statin.  2.  Fall.  Likely mechanical.  No skeletal injury.  Monitor.  She is independent and mobile.  Will not benefit within PT OT.  3.  Dementia: Currently stable.  On Namenda and Aricept.  Melatonin at night.  Fall precautions.  Delirium precautions.  4.  History of stroke: No new deficits.  On a statin and Plavix.  Continue.  DVT prophylaxis: Lovenox subcu Code Status: Full code Family Communication: Daughter-in-law at the bedside Disposition Plan: Back to assisted living facility Consults called: Cardiology Admission status: Cardiac telemetry   Barb Merino MD [facility] Pager [phone]

## 2021-05-22 NOTE — ED Notes (Signed)
RN ordered pt dinner tray °

## 2021-05-22 NOTE — ED Notes (Signed)
In room to check on patient to find her standing at bedside with monitor unattached. When asked what she was doing and why she done this, she replied "I want to go to bed but I want to go when my sons go"

## 2021-05-23 ENCOUNTER — Inpatient Hospital Stay (HOSPITAL_COMMUNITY): Payer: Medicare Other

## 2021-05-23 ENCOUNTER — Other Ambulatory Visit (HOSPITAL_COMMUNITY): Payer: Medicare Other

## 2021-05-23 DIAGNOSIS — R778 Other specified abnormalities of plasma proteins: Secondary | ICD-10-CM

## 2021-05-23 DIAGNOSIS — I241 Dressler's syndrome: Secondary | ICD-10-CM

## 2021-05-23 DIAGNOSIS — I214 Non-ST elevation (NSTEMI) myocardial infarction: Secondary | ICD-10-CM | POA: Diagnosis not present

## 2021-05-23 DIAGNOSIS — R7989 Other specified abnormal findings of blood chemistry: Secondary | ICD-10-CM | POA: Diagnosis not present

## 2021-05-23 LAB — ECHOCARDIOGRAM COMPLETE
Area-P 1/2: 4.21 cm2
Height: 66 in
S' Lateral: 4.3 cm
Single Plane A2C EF: 38.5 %
Weight: 1760 oz

## 2021-05-23 LAB — TROPONIN I (HIGH SENSITIVITY)
Troponin I (High Sensitivity): 2432 ng/L (ref <18)
Troponin I (High Sensitivity): 2321 ng/L (ref <18)

## 2021-05-23 MED ORDER — ALENDRONATE SODIUM 70 MG PO TABS: 70.0000 mg | ORAL_TABLET | ORAL | Status: DC

## 2021-05-23 MED ORDER — POTASSIUM CHLORIDE CRYS ER 20 MEQ PO TBCR
20.0000 meq | EXTENDED_RELEASE_TABLET | Freq: Every day | ORAL | Status: DC
  Administered 2021-05-23 – 2021-05-24 (×2): 20 meq via ORAL
  Filled 2021-05-23 (×2): qty 1

## 2021-05-23 MED ORDER — ATORVASTATIN CALCIUM 10 MG PO TABS
10.0000 mg | ORAL_TABLET | Freq: Every day | ORAL | Status: DC
  Administered 2021-05-23 – 2021-05-24 (×2): 10 mg via ORAL
  Filled 2021-05-23 (×2): qty 1

## 2021-05-23 MED ORDER — CLOPIDOGREL BISULFATE 75 MG PO TABS
75.0000 mg | ORAL_TABLET | Freq: Every day | ORAL | Status: DC
  Administered 2021-05-23 – 2021-05-24 (×2): 75 mg via ORAL
  Filled 2021-05-23 (×2): qty 1

## 2021-05-23 MED ORDER — FUROSEMIDE 40 MG PO TABS
40.0000 mg | ORAL_TABLET | Freq: Every day | ORAL | Status: DC
  Administered 2021-05-23: 40 mg via ORAL
  Filled 2021-05-23: qty 2

## 2021-05-23 MED ORDER — LORAZEPAM 2 MG/ML IJ SOLN
1.0000 mg | Freq: Once | INTRAMUSCULAR | Status: AC
  Administered 2021-05-23: 1 mg via INTRAVENOUS
  Filled 2021-05-23: qty 1

## 2021-05-23 MED ORDER — CALCIUM CARBONATE-VITAMIN D 500-200 MG-UNIT PO TABS
1.0000 | ORAL_TABLET | Freq: Every evening | ORAL | Status: DC
  Administered 2021-05-23: 1 via ORAL
  Filled 2021-05-23: qty 1

## 2021-05-23 MED ORDER — ENSURE ENLIVE PO LIQD: 237.0000 mL | Freq: Two times a day (BID) | ORAL | Status: DC

## 2021-05-23 MED ORDER — ACETAMINOPHEN 650 MG RE SUPP: 650.0000 mg | Freq: Four times a day (QID) | RECTAL | Status: DC | PRN

## 2021-05-23 MED ORDER — DONEPEZIL HCL 10 MG PO TABS
10.0000 mg | ORAL_TABLET | Freq: Every day | ORAL | Status: DC
  Administered 2021-05-23 – 2021-05-24 (×2): 10 mg via ORAL
  Filled 2021-05-23 (×3): qty 1

## 2021-05-23 MED ORDER — ADULT MULTIVITAMIN W/MINERALS CH
1.0000 | ORAL_TABLET | Freq: Every day | ORAL | Status: DC
  Administered 2021-05-23 – 2021-05-24 (×2): 1 via ORAL
  Filled 2021-05-23 (×2): qty 1

## 2021-05-23 MED ORDER — MELATONIN 5 MG PO TABS
5.0000 mg | ORAL_TABLET | Freq: Every day | ORAL | Status: DC
  Administered 2021-05-23 (×2): 5 mg via ORAL
  Filled 2021-05-23 (×2): qty 1

## 2021-05-23 MED ORDER — VITAMIN B-12 1000 MCG PO TABS
1000.0000 ug | ORAL_TABLET | Freq: Every day | ORAL | Status: DC
  Administered 2021-05-23 – 2021-05-24 (×2): 1000 ug via ORAL
  Filled 2021-05-23 (×2): qty 1

## 2021-05-23 MED ORDER — ACETAMINOPHEN 325 MG PO TABS
650.0000 mg | ORAL_TABLET | Freq: Four times a day (QID) | ORAL | Status: DC | PRN
  Administered 2021-05-23: 650 mg via ORAL
  Filled 2021-05-23: qty 2

## 2021-05-23 MED ORDER — ENOXAPARIN SODIUM 30 MG/0.3ML IJ SOSY
30.0000 mg | PREFILLED_SYRINGE | INTRAMUSCULAR | Status: DC
  Administered 2021-05-23: 30 mg via SUBCUTANEOUS
  Filled 2021-05-23: qty 0.3

## 2021-05-23 MED ORDER — MEMANTINE HCL 10 MG PO TABS
10.0000 mg | ORAL_TABLET | Freq: Two times a day (BID) | ORAL | Status: DC
  Administered 2021-05-23 – 2021-05-24 (×4): 10 mg via ORAL
  Filled 2021-05-23 (×5): qty 1

## 2021-05-23 NOTE — Evaluation (Addendum)
Physical Therapy Evaluation/Discharge Patient Details Name: Sara Perkins MRN: [id number] DOB: [birth year] Today's Date: 05/23/2021  History of Present Illness  Pt is an 85 y.o. female who presented 05/22/21 s/p unwitnessed fall. CT head and C-spine negative for acute intracranial hemorrhages or other abnormalities. Noted elevated troponin, possibly demand ischemia in setting of fall/trauma. EKG without acute ischemic changes. PMH: NICM with improved LVEF, TIA, demenita  and hx of syncope s/p dual chamber PPM in 2010   Clinical Impression  Pt presents with condition above. PTA, she was living at an ALF, functioning independently without AD/AE. Pt's family reports this has been her first fall in years, and the last time she fell years ago was due to low BP. Currently, pt is ambulating at a slightly slower speed and with some stiffness compared to son's report of her baseline. However, this may be due to pain from the fall. Her strength, sensation, and coordination in all her limbs are WFL. She does display some mild balance deficits, particularly when challenging her vestibular system when ambulating, but did not display any bout of LOB. Her DGI score today was 16, indicating she is at risk for falls, but she appropriately alters her gait to accommodate for dynamic balance challenges to ensure no LOB. Pt did not need any physical assistance either. Expect pt will quickly return to her baseline naturally as her pain decreases and she continues to mobilize. Pt could benefit from a shower chair to ensure safety with bathing as she is at risk for falls. No PT follow-up necessary at this time. All education completed and questions answered. PT will sign off.      Recommendations for follow up therapy are one component of a multi-disciplinary discharge planning process, led by the attending physician.  Recommendations may be updated based on patient status, additional functional criteria and insurance  authorization.  Follow Up Recommendations No PT follow up    Equipment Recommendations  Other (comment) (shower chair)    Recommendations for Other Services       Precautions / Restrictions Precautions Precautions: Fall (low) Restrictions Weight Bearing Restrictions: No      Mobility  Bed Mobility Overal bed mobility: Modified Independent             General bed mobility comments: Pt able to transition sit > supine in bed without assistance.    Transfers Overall transfer level: Independent Equipment used: None             General transfer comment: Pt able to transfer to stand without LOB or assistance.  Ambulation/Gait Ambulation/Gait assistance: Supervision Gait Distance (Feet): 270 Feet (x2 bouts of ~270 ft > ~60 ft) Assistive device: None Gait Pattern/deviations: Step-through pattern;Decreased step length - left;Decreased stride length Gait velocity: reduced Gait velocity interpretation: >2.62 ft/sec, indicative of community ambulatory (when cued to increase speed) General Gait Details: Pt with slow self-selected gait pace, with pt's son reports is slower than her normal. Pt with slightly decreased L step length, reporting some pain/stiffness that could be causing it. Improved speed when wearing her shoes than when bare foot. Slows gait speed significantly when changing head positions or having to step over an obstacle, but no LOB. Able to change speeds and directions without LOB, supervision for safety.  Stairs            Wheelchair Mobility    Modified Rankin (Stroke Patients Only) Modified Rankin (Stroke Patients Only) Pre-Morbid Rankin Score: Slight disability Modified Rankin: Slight disability     Balance  Overall balance assessment: Mild deficits observed, not formally tested                               Standardized Balance Assessment Standardized Balance Assessment : Dynamic Gait Index   Dynamic Gait Index Level  Surface: Mild Impairment Change in Gait Speed: Normal Gait with Horizontal Head Turns: Moderate Impairment Gait with Vertical Head Turns: Moderate Impairment Gait and Pivot Turn: Normal Step Over Obstacle: Mild Impairment Step Around Obstacles: Normal Steps: Moderate Impairment (assumed, not tested) Total Score: 16       Pertinent Vitals/Pain Pain Assessment: Faces Faces Pain Scale: Hurts a little bit Pain Location: generalized stiffness Pain Descriptors / Indicators: Other (Comment) (stiffness) Pain Intervention(s): Limited activity within patient's tolerance;Monitored during session;Repositioned    Home Living Family/patient expects to be discharged to:: Assisted living                 Additional Comments: Pt's son and daughter-in-law present reporting pt has a walk-in shower without anywhere to sit.    Prior Function Level of Independence: Independent         Comments: Walks without AD and stands during showers. Pt's family reports this was her first fall in years, last time it was due to low BP.     Hand Dominance   Dominant Hand: Right    Extremity/Trunk Assessment   Upper Extremity Assessment Upper Extremity Assessment: Overall WFL for tasks assessed (MMT scores of 4+ to 5 grossly bil; denied numbness/tingling bil; no dysdiadochokinesia or dysmetria noted bil)    Lower Extremity Assessment Lower Extremity Assessment: Overall WFL for tasks assessed (MMT scores of 4+ to 5 grossly bil; denied numbness/tingling bil; difficulty noted with simultaneous bil feet taps, possibly due to poor command following)    Cervical / Trunk Assessment Cervical / Trunk Assessment: Kyphotic  Communication   Communication: No difficulties  Cognition Arousal/Alertness: Awake/alert Behavior During Therapy: WFL for tasks assessed/performed Overall Cognitive Status: History of cognitive impairments - at baseline                                 General Comments:  dementia at baseline. Did not recognize her daughter-in-law. Son and daughter-in-law had to provide PLOF info as pt was unable to recall. Pt with difficulty expressing any symptoms or identifying locations of pain. Difficulty following multi-step commands.      General Comments General comments (skin integrity, edema, etc.): Educated pt and her son and daughter-in-law that pt is at a risk for falls, but likely a low risk, and to notifiy RN or PT if pt has symptoms of lightheadedness or dizziness; pt denied spinning/dizziness, had difficulty following cues to perform [employer] tests appropriately; educated pt to have UE support when having to chnage head positions to look for or reach things to ensure safety as challenging her vestibular system while ambulating appeared to be the most difficult aspect for her    Exercises     Assessment/Plan    PT Assessment Patent does not need any further PT services  PT Problem List         PT Treatment Interventions      PT Goals (Current goals can be found in the Care Plan section)  Acute Rehab PT Goals Patient Stated Goal: to get back to normal PT Goal Formulation: All assessment and education complete, DC therapy Time For  Goal Achievement: 05/24/21 Potential to Achieve Goals: Good    Frequency     Barriers to discharge        Co-evaluation               AM-PAC PT "6 Clicks" Mobility  Outcome Measure Help needed turning from your back to your side while in a flat bed without using bedrails?: None Help needed moving from lying on your back to sitting on the side of a flat bed without using bedrails?: None Help needed moving to and from a bed to a chair (including a wheelchair)?: None Help needed standing up from a chair using your arms (e.g., wheelchair or bedside chair)?: None Help needed to walk in hospital room?: A Little Help needed climbing 3-5 steps with a railing? : A Little 6 Click Score: 22    End of Session Equipment  Utilized During Treatment: Gait belt Activity Tolerance: Patient tolerated treatment well Patient left: in bed;with call bell/phone within reach;Other (comment) (having imaging taken) Nurse Communication: Mobility status PT Visit Diagnosis: Unsteadiness on feet (R26.81);Other abnormalities of gait and mobility (R26.89)    Time: 8550-1586 PT Time Calculation (min) (ACUTE ONLY): 28 min   Charges:   PT Evaluation $PT Eval Low Complexity: 1 Low PT Treatments $Gait Training: 8-22 mins        Moishe Spice, PT, DPT Acute Rehabilitation Services  Pager: [phone] Office: [phone]   Orvan Falconer 05/23/2021, 4:54 PM

## 2021-05-23 NOTE — ED Notes (Addendum)
Pt continuously getting up from bed every few minutes. Attempted to redirect by this RN multiple times. Pt has been repositioned, to the bathroom and unable to comfort patient. Pt started swinging and kicking at this RN and yelling to "leave me alone!" Pt still attempting to get up and is not redirectable. Admitting notified.   Fall precautions in place. Pt has on non-skid socks and is in a hospital bed with bed alarm on.

## 2021-05-23 NOTE — Progress Notes (Signed)
PROGRESS NOTE    Sara Perkins  RTM:[id number] DOB: 08/05/1936 DOA: 05/22/2021 PCP: Lajean Manes, MD   Brief Narrative:  Sara Perkins is a 85 y.o. female with medical history significant of sick sinus syndrome status post pacemaker, TIA, dementia brought to the ER with fall.  Patient is poor historian.  Fortunately patient's imaging in the ED was unremarkable for any acute fractures or findings, unfortunately patient's troponin was noted to be quite elevated more than 2000, cardiology was consulted for further evaluation and treatment, hospitalist called to admit.    Assessment & Plan:  Elevated troponin; unclear etiology, demand ischemia versus underlying structural disease  Troponin flat, cardiology following  Echo currently pending, notable if no wall motion abnormalities likely no further inpatient work-up indicated, discussed with patient's son who is primary caretaker -agree for no heroic measures, hope to have patient discharged by tomorrow given that is her 85rd birthday   Fall, likely mechanical, imaging negative -no further work-up at this time, PT OT ordered given baseline is independent, ensure no deficits or need for further physical therapy   Dementia, advanced, at baseline:  Continue home Namenda and Aricept.  Melatonin at night.  Fall precautions.  Delirium precautions -high risk for sundowning per discussion with son  History of stroke: No new deficits.  On a statin and Plavix.  Continue.   DVT prophylaxis: Lovenox subcu Code Status: Full code Family Communication: Son updated over the phone, at bedside this afternoon  Status is: Inpatient  Dispo: The patient is from: Facility              Anticipated d/c is to: Same              Anticipated d/c date is: 24 to 48 hours              Patient currently not medically stable for discharge  Consultants:  Cardiology  Procedures:  None  Antimicrobials:  None  Subjective: No acute issues or events overnight,  patient still denies chest pain nausea vomiting dyspnea or palpitations.  Complains of mild pain on her face where she fell but this continues to improve  Objective: Vitals:   05/23/21 0430 05/23/21 0433 05/23/21 0600 05/23/21 0637  BP:  (!) 141/62  (!) 132/54  Pulse: 62 62 (!) 53 60  Resp: 14 16 18 14   Temp:      TempSrc:      SpO2: 97% 96% 91% 96%  Weight:      Height:       No intake or output data in the 24 hours ending 05/23/21 0831 Filed Weights   05/22/21 1423  Weight: 49.9 kg    Examination:  General:  Pleasantly resting in bed, No acute distress. HEENT:  Normocephalic atraumatic.  Sclerae nonicteric, noninjected.  Extraocular movements intact bilaterally. Neck:  Without mass or deformity.  Trachea is midline. Lungs:  Clear to auscultate bilaterally without rhonchi, wheeze, or rales. Heart:  Regular rate and rhythm.  Without murmurs, rubs, or gallops. Pacemaker noted Abdomen:  Soft, nontender, nondistended.  Without guarding or rebound. Extremities: Without cyanosis, clubbing, edema, or obvious deformity. Vascular:  Dorsalis pedis and posterior tibial pulses palpable bilaterally. Skin:  Warm and dry, no erythema, no ulcerations.   Data Reviewed: I have personally reviewed following labs and imaging studies  CBC: Recent Labs  Lab 05/22/21 1411  WBC 6.2  NEUTROABS 4.4  HGB 13.9  HCT 43.5  MCV 95.8  PLT 014   Basic Metabolic Panel: Recent  Labs  Lab 05/22/21 1411  NA 133*  K 4.6  CL 100  CO2 21*  GLUCOSE 152*  BUN 16  CREATININE 0.89  CALCIUM 8.8*   GFR: Estimated Creatinine Clearance: 38.4 mL/min (by C-G formula based on SCr of 0.89 mg/dL). Liver Function Tests: Recent Labs  Lab 05/22/21 1411  AST 33  ALT 15  ALKPHOS 61  BILITOT 0.7  PROT 6.4*  ALBUMIN 3.7   No results for input(s): LIPASE, AMYLASE in the last 168 hours. No results for input(s): AMMONIA in the last 168 hours. Coagulation Profile: No results for input(s): INR, PROTIME in  the last 168 hours. Cardiac Enzymes: No results for input(s): CKTOTAL, CKMB, CKMBINDEX, TROPONINI in the last 168 hours. BNP (last 3 results) No results for input(s): PROBNP in the last 8760 hours. HbA1C: No results for input(s): HGBA1C in the last 72 hours. CBG: No results for input(s): GLUCAP in the last 168 hours. Lipid Profile: No results for input(s): CHOL, HDL, LDLCALC, TRIG, CHOLHDL, LDLDIRECT in the last 72 hours. Thyroid Function Tests: No results for input(s): TSH, T4TOTAL, FREET4, T3FREE, THYROIDAB in the last 72 hours. Anemia Panel: No results for input(s): VITAMINB12, FOLATE, FERRITIN, TIBC, IRON, RETICCTPCT in the last 72 hours. Sepsis Labs: No results for input(s): PROCALCITON, LATICACIDVEN in the last 168 hours.  Recent Results (from the past 240 hour(s))  Resp Panel by RT-PCR (Flu A&B, Covid) Nasopharyngeal Swab     Status: None   Collection Time: 05/22/21  5:04 PM   Specimen: Nasopharyngeal Swab; Nasopharyngeal(NP) swabs in vial transport medium  Result Value Ref Range Status   SARS Coronavirus 2 by RT PCR NEGATIVE NEGATIVE Final    Comment: (NOTE) SARS-CoV-2 target nucleic acids are NOT DETECTED.  The SARS-CoV-2 RNA is generally detectable in upper respiratory specimens during the acute phase of infection. The lowest concentration of SARS-CoV-2 viral copies this assay can detect is 138 copies/mL. A negative result does not preclude SARS-Cov-2 infection and should not be used as the sole basis for treatment or other patient management decisions. A negative result may occur with  improper specimen collection/handling, submission of specimen other than nasopharyngeal swab, presence of viral mutation(s) within the areas targeted by this assay, and inadequate number of viral copies(<138 copies/mL). A negative result must be combined with clinical observations, patient history, and epidemiological information. The expected result is Negative.  Fact Sheet for  Patients:  EntrepreneurPulse.com.au  Fact Sheet for Healthcare Providers:  IncredibleEmployment.be  This test is no t yet approved or cleared by the [place] FDA and  has been authorized for detection and/or diagnosis of SARS-CoV-2 by FDA under an Emergency Use Authorization (EUA). This EUA will remain  in effect (meaning this test can be used) for the duration of the COVID-19 declaration under Section 564(b)(1) of the Act, 21 U.S.C.section 360bbb-3(b)(1), unless the authorization is terminated  or revoked sooner.       Influenza A by PCR NEGATIVE NEGATIVE Final   Influenza B by PCR NEGATIVE NEGATIVE Final    Comment: (NOTE) The Xpert Xpress SARS-CoV-2/FLU/RSV plus assay is intended as an aid in the diagnosis of influenza from Nasopharyngeal swab specimens and should not be used as a sole basis for treatment. Nasal washings and aspirates are unacceptable for Xpert Xpress SARS-CoV-2/FLU/RSV testing.  Fact Sheet for Patients: EntrepreneurPulse.com.au  Fact Sheet for Healthcare Providers: IncredibleEmployment.be  This test is not yet approved or cleared by the [place] FDA and has been authorized for detection and/or diagnosis of SARS-CoV-2  by FDA under an Emergency Use Authorization (EUA). This EUA will remain in effect (meaning this test can be used) for the duration of the COVID-19 declaration under Section 564(b)(1) of the Act, 21 U.S.C. section 360bbb-3(b)(1), unless the authorization is terminated or revoked.  Performed at [facility], [place] [address] [place], [place] [address]          Radiology Studies: CT Head Wo Contrast  Result Date: 05/22/2021 CLINICAL DATA:  Head trauma, mod-severe.  Fall. EXAM: CT HEAD WITHOUT CONTRAST TECHNIQUE: Contiguous axial images were obtained from the base of the skull through the vertex without intravenous contrast. COMPARISON:  None.  FINDINGS: Brain: There is atrophy and chronic small vessel disease changes. No acute intracranial abnormality. Specifically, no hemorrhage, hydrocephalus, mass lesion, acute infarction, or significant intracranial injury. Vascular: No hyperdense vessel or unexpected calcification. Skull: No acute calvarial abnormality. Sinuses/Orbits: No acute findings Other: None IMPRESSION: Atrophy, chronic microvascular disease. No acute intracranial abnormality. Electronically Signed   By: Rolm Baptise M.D.   On: 05/22/2021 15:39   CT Cervical Spine Wo Contrast  Result Date: 05/22/2021 CLINICAL DATA:  Fall.  Neck trauma (Age >= 65y) EXAM: CT CERVICAL SPINE WITHOUT CONTRAST TECHNIQUE: Multidetector CT imaging of the cervical spine was performed without intravenous contrast. Multiplanar CT image reconstructions were also generated. COMPARISON:  None. FINDINGS: Alignment: Slight degenerative anterolisthesis of C3 on C4. Skull base and vertebrae: No acute fracture. No primary bone lesion or focal pathologic process. Soft tissues and spinal canal: No prevertebral fluid or swelling. No visible canal hematoma. Disc levels: Diffuse degenerative disc disease and facet disease. Disc disease is most pronounced from C4-5 through C6-7. Facet disease is most pronounced on the left. Upper chest: Biapical scarring.  No acute findings Other: None IMPRESSION: Degenerative disc and facet disease.  No acute bony abnormality. Electronically Signed   By: Rolm Baptise M.D.   On: 05/22/2021 15:40   CUP PACEART REMOTE DEVICE CHECK  Result Date: 05/22/2021 Scheduled remote reviewed. Normal device function.  2 PAT (short), 1 NSVT, 24 beats on 02/24/21. Routing for further review per protocol Next remote 91 days- JBox, RN/CVRS   Scheduled Meds:  atorvastatin  10 mg Oral Daily   calcium-vitamin D  1 tablet Oral QPM   clopidogrel  75 mg Oral Daily   donepezil  10 mg Oral Daily   enoxaparin (LOVENOX) injection  30 mg Subcutaneous Q24H    furosemide  40 mg Oral Daily   melatonin  5 mg Oral QHS   memantine  10 mg Oral BID   multivitamin with minerals  1 tablet Oral Daily   potassium chloride SA  20 mEq Oral Daily   vitamin B-12  1,000 mcg Oral Daily     LOS: 1 day   Time spent: 21min  Milliani Herrada C Shontel Santee, DO [facility]  If 7PM-7AM, please contact night-coverage [email or web address]  05/23/2021, 8:31 AM

## 2021-05-23 NOTE — Progress Notes (Signed)
Nutrition Brief Note  Patient identified on the Malnutrition Screening Tool (MST) Report  Wt Readings from Last 15 Encounters:  05/22/21 49.9 kg  09/21/19 55.3 kg  08/19/18 56.7 kg  04/27/18 55.9 kg  02/23/18 55.1 kg  01/28/18 54 kg   Sara Perkins is a 85 y.o. female with medical history significant of sick sinus syndrome status post pacemaker, TIA, dementia brought to the ER with fall.  Patient is poor historian.  She is pleasant, does not endorse any current complaints.  She lives in assisted living place.  She was noted to be walking around with blood on her hair , so assisted-living staff were alerted and sent her to the ER.  Patient unable to recall whether she had any syncopal episode or mechanical fall.  She tells me that she cannot recall as she might have fallen many times in her lifetime.  Pt admitted with NSTEMI.   Case discussed with RN, who reports pt just recently arrived to floor and is very confused.   Spoke with pt and family at bedside. Pt family reports pt with very good appetite, consumes 3 meals per day at ALF. Family also takes her out for dinner every Sunday and she eats well. Pt consumed some spaghetti for lunch, but did not like it.   Per pt family, pt wt has been stable, Her UBW is around 128#.   Nutrition-Focused physical exam completed. Findings are mild fat depletion, mild muscle depletion, and no edema. Pt with generalized fat and muscle depletions, likely related to advanced age.   Current diet order is Heart Healthy, patient is consuming approximately n/a% of meals at this time. Labs and medications reviewed.   No nutrition interventions warranted at this time. If nutrition issues arise, please consult RD.   Loistine Chance, RD, LDN, [place] Registered Dietitian II Certified Diabetes Care and Education Specialist Please refer to [facility] for RD and/or RD on-call/weekend/after hours pager

## 2021-05-23 NOTE — Plan of Care (Signed)

## 2021-05-23 NOTE — Progress Notes (Signed)
  Echocardiogram 2D Echocardiogram has been performed.  Sara Perkins 05/23/2021, 5:07 PM

## 2021-05-23 NOTE — ED Notes (Signed)
Breakfast order placed ?

## 2021-05-23 NOTE — Progress Notes (Signed)
Progress Note  Patient Name: Sara Perkins Date of Encounter: 05/23/2021  Primary Cardiologist:   None   Subjective   Confused.  Denies any complaints other than "my body hurts."    Inpatient Medications    Scheduled Meds:  atorvastatin  10 mg Oral Daily   calcium-vitamin D  1 tablet Oral QPM   clopidogrel  75 mg Oral Daily   donepezil  10 mg Oral Daily   enoxaparin (LOVENOX) injection  30 mg Subcutaneous Q24H   furosemide  40 mg Oral Daily   melatonin  5 mg Oral QHS   memantine  10 mg Oral BID   multivitamin with minerals  1 tablet Oral Daily   potassium chloride SA  20 mEq Oral Daily   vitamin B-12  1,000 mcg Oral Daily   Continuous Infusions:  PRN Meds: acetaminophen **OR** acetaminophen   Vital Signs    Vitals:   05/23/21 0430 05/23/21 0433 05/23/21 0600 05/23/21 0637  BP:  (!) 141/62  (!) 132/54  Pulse: 62 62 (!) 53 60  Resp: 14 16 18 14   Temp:      TempSrc:      SpO2: 97% 96% 91% 96%  Weight:      Height:       No intake or output data in the 24 hours ending 05/23/21 0708 Filed Weights   05/22/21 1423  Weight: 49.9 kg     ECG    NA - Personally Reviewed  Physical Exam   GEN: No acute distress.   Neck: No  JVD Cardiac: RRR, no murmurs, rubs, or gallops.  Respiratory: Clear  to auscultation bilaterally. GI: Soft, nontender, non-distended  MS: No  edema; No deformity. Neuro:  Nonfocal  Psych:   Oriented only to person.    Labs    Chemistry Recent Labs  Lab 05/22/21 1411  NA 133*  K 4.6  CL 100  CO2 21*  GLUCOSE 152*  BUN 16  CREATININE 0.89  CALCIUM 8.8*  PROT 6.4*  ALBUMIN 3.7  AST 33  ALT 15  ALKPHOS 61  BILITOT 0.7  GFRNONAA >60  ANIONGAP 12     Hematology Recent Labs  Lab 05/22/21 1411  WBC 6.2  RBC 4.54  HGB 13.9  HCT 43.5  MCV 95.8  MCH 30.6  MCHC 32.0  RDW 13.5  PLT 204    Cardiac EnzymesNo results for input(s): TROPONINI in the last 168 hours. No results for input(s): TROPIPOC in the last 168  hours.   BNPNo results for input(s): BNP, PROBNP in the last 168 hours.   DDimer No results for input(s): DDIMER in the last 168 hours.   Radiology    CT Head Wo Contrast  Result Date: 05/22/2021 CLINICAL DATA:  Head trauma, mod-severe.  Fall. EXAM: CT HEAD WITHOUT CONTRAST TECHNIQUE: Contiguous axial images were obtained from the base of the skull through the vertex without intravenous contrast. COMPARISON:  None. FINDINGS: Brain: There is atrophy and chronic small vessel disease changes. No acute intracranial abnormality. Specifically, no hemorrhage, hydrocephalus, mass lesion, acute infarction, or significant intracranial injury. Vascular: No hyperdense vessel or unexpected calcification. Skull: No acute calvarial abnormality. Sinuses/Orbits: No acute findings Other: None IMPRESSION: Atrophy, chronic microvascular disease. No acute intracranial abnormality. Electronically Signed   By: Rolm Baptise M.D.   On: 05/22/2021 15:39   CT Cervical Spine Wo Contrast  Result Date: 05/22/2021 CLINICAL DATA:  Fall.  Neck trauma (Age >= 65y) EXAM: CT CERVICAL SPINE WITHOUT CONTRAST TECHNIQUE: Multidetector  CT imaging of the cervical spine was performed without intravenous contrast. Multiplanar CT image reconstructions were also generated. COMPARISON:  None. FINDINGS: Alignment: Slight degenerative anterolisthesis of C3 on C4. Skull base and vertebrae: No acute fracture. No primary bone lesion or focal pathologic process. Soft tissues and spinal canal: No prevertebral fluid or swelling. No visible canal hematoma. Disc levels: Diffuse degenerative disc disease and facet disease. Disc disease is most pronounced from C4-5 through C6-7. Facet disease is most pronounced on the left. Upper chest: Biapical scarring.  No acute findings Other: None IMPRESSION: Degenerative disc and facet disease.  No acute bony abnormality. Electronically Signed   By: Rolm Baptise M.D.   On: 05/22/2021 15:40   CUP PACEART REMOTE DEVICE  CHECK  Result Date: 05/22/2021 Scheduled remote reviewed. Normal device function.  2 PAT (short), 1 NSVT, 24 beats on 02/24/21. Routing for further review per protocol Next remote 91 days- JBox, RN/CVRS   Cardiac Studies   ECHO:  Pending  Patient Profile     85 y.o. female  with a hx of NICM with improved LVEF, TIA, demenita  and hx of syncope s/p dual chamber PPM in 2010 who is being seen 05/22/2021 for the evaluation of elevated troponin at the request of Dr. Langston Masker.    Assessment & Plan    ELEVATED TROPONIN:   Trend is flat.  Echo pending.  If there are no wall motion abnormalities or change in EF then no further work up is planned.    PACEMAKER:  Interrogated yesterday.  Two short runs of PAT.  One run of NSVT of 24 beats did not correlate with events yesterday this was apparently asymptomatic in July.  No change in therapy.   For questions or updates, please contact [place] Please consult [email or web address] for contact info under Cardiology/STEMI.   Signed, Minus Breeding, MD  05/23/2021, 7:08 AM

## 2021-05-24 DIAGNOSIS — I214 Non-ST elevation (NSTEMI) myocardial infarction: Secondary | ICD-10-CM | POA: Diagnosis not present

## 2021-05-24 MED ORDER — VITAMIN D 125 MCG (5000 UT) PO CAPS: 1.0000 | ORAL_CAPSULE | Freq: Every day | ORAL | 0 refills | Status: AC

## 2021-05-24 NOTE — Discharge Summary (Signed)
Physician Discharge Summary  Sara Perkins EZM:[id number] DOB: [birth year] DOA: 05/22/2021  PCP: Lajean Manes, MD  Admit date: 05/22/2021 Discharge date: 05/24/2021  Admitted From: Home Disposition: Home  Recommendations for Outpatient Follow-up:  Follow up with PCP in 1-2 weeks Please obtain BMP/CBC in one week Please follow with cardiology as scheduled  Home Health: None Equipment/Devices: None  Discharge Condition: Stable CODE STATUS: Full Diet recommendation: As tolerated  Brief/Interim Summary: Sara Perkins is a 85 y.o. female with medical history significant of sick sinus syndrome status post pacemaker, TIA, dementia brought to the ER with fall.  Patient is poor historian.  Fortunately patient's imaging in the ED was unremarkable for any acute fractures or findings, unfortunately patient's troponin was noted to be quite elevated more than 2000, cardiology was consulted for further evaluation and treatment, hospitalist called to admit.  Patient's imaging was unremarkable as below, cardiology following for elevated troponin which was noted to be flat.  Given unremarkable echo as below patient otherwise planned for discharge home with family, lengthy discussion with son about outpatient follow-up with PCP and cardiology as scheduled.  Otherwise no further indication for ongoing treatment or imaging.   Assessment & Plan:   Elevated troponin; unclear etiology, demand ischemia versus underlying structural disease  Troponin flat, cardiology following  Echo without regional wall motion abnormalities - no further inpatient work-up indicated, discussed with patient's son who is primary caretaker -agree for no heroic measures, plan for discharge later today   Fall, likely mechanical, imaging negative -no further work-up at this time, PT OT ordered given baseline is independent, ensure no deficits or need for further physical therapy   Dementia, advanced, at baseline:  Continue home  Namenda and Aricept.  Melatonin at night.  Fall precautions.  Delirium precautions -high risk for sundowning per discussion with son   History of stroke: No new deficits.  On a statin and Plavix.  Continue.  Discharge Diagnoses:  Principal Problem:   NSTEMI (non-ST elevated myocardial infarction) [facility]) Active Problems:   Pacemaker   Fall at home, initial encounter    Discharge Instructions  Discharge Instructions     Diet - low sodium heart healthy   Complete by: As directed    Increase activity slowly   Complete by: As directed       Allergies as of 05/24/2021   No Known Allergies      Medication List     STOP taking these medications    metoprolol succinate 25 MG 24 hr tablet Commonly known as: Toprol XL   Potassium Chloride ER 20 MEQ Tbcr       TAKE these medications    alendronate 70 MG tablet Commonly known as: FOSAMAX Take 70 mg by mouth once a week. Take on Fridays   atorvastatin 10 MG tablet Commonly known as: LIPITOR TAKE 1 TABLET BY MOUTH  DAILY   calcium-vitamin D 500-200 MG-UNIT tablet Commonly known as: OSCAL WITH D Take 1 tablet by mouth every evening. With dinner   Centrum Adults Tabs Take 1 tablet by mouth daily.   clopidogrel 75 MG tablet Commonly known as: PLAVIX TAKE 1 TABLET BY MOUTH  DAILY   divalproex 125 MG capsule Commonly known as: DEPAKOTE SPRINKLE Take 125 mg by mouth daily as needed.   donepezil 10 MG tablet Commonly known as: ARICEPT Take 10 mg by mouth daily.   furosemide 40 MG tablet Commonly known as: LASIX Take 1 tablet (40 mg total) by mouth daily.   melatonin 5 MG  Tabs Take 5 mg by mouth at bedtime.   memantine 10 MG tablet Commonly known as: NAMENDA Take 10 mg by mouth 2 (two) times daily.   potassium chloride SA 20 MEQ tablet Commonly known as: KLOR-CON Take 1 tablet (20 mEq total) by mouth daily.   vitamin B-12 1000 MCG tablet Commonly known as: CYANOCOBALAMIN Take 1,000 mcg by mouth in the  morning and at bedtime.   Vitamin D 125 MCG (5000 UT) Caps Take 1 capsule by mouth daily. What changed:  medication strength how much to take        No Known Allergies  Consultations: Cardiology  Procedures/Studies: CT Head Wo Contrast  Result Date: 05/22/2021 CLINICAL DATA:  Head trauma, mod-severe.  Fall. EXAM: CT HEAD WITHOUT CONTRAST TECHNIQUE: Contiguous axial images were obtained from the base of the skull through the vertex without intravenous contrast. COMPARISON:  None. FINDINGS: Brain: There is atrophy and chronic small vessel disease changes. No acute intracranial abnormality. Specifically, no hemorrhage, hydrocephalus, mass lesion, acute infarction, or significant intracranial injury. Vascular: No hyperdense vessel or unexpected calcification. Skull: No acute calvarial abnormality. Sinuses/Orbits: No acute findings Other: None IMPRESSION: Atrophy, chronic microvascular disease. No acute intracranial abnormality. Electronically Signed   By: Rolm Baptise M.D.   On: 05/22/2021 15:39   CT Cervical Spine Wo Contrast  Result Date: 05/22/2021 CLINICAL DATA:  Fall.  Neck trauma (Age >= 65y) EXAM: CT CERVICAL SPINE WITHOUT CONTRAST TECHNIQUE: Multidetector CT imaging of the cervical spine was performed without intravenous contrast. Multiplanar CT image reconstructions were also generated. COMPARISON:  None. FINDINGS: Alignment: Slight degenerative anterolisthesis of C3 on C4. Skull base and vertebrae: No acute fracture. No primary bone lesion or focal pathologic process. Soft tissues and spinal canal: No prevertebral fluid or swelling. No visible canal hematoma. Disc levels: Diffuse degenerative disc disease and facet disease. Disc disease is most pronounced from C4-5 through C6-7. Facet disease is most pronounced on the left. Upper chest: Biapical scarring.  No acute findings Other: None IMPRESSION: Degenerative disc and facet disease.  No acute bony abnormality. Electronically Signed    By: Rolm Baptise M.D.   On: 05/22/2021 15:40   ECHOCARDIOGRAM COMPLETE  Result Date: 05/23/2021    ECHOCARDIOGRAM REPORT   Patient Name:   Sara Perkins Date of Exam: 05/23/2021 Medical Rec #:  [id number]      Height:       66.0 in Accession #:    [id number]     Weight:       110.0 lb Date of Birth:  Oct 21, 1935     BSA:          1.551 m Patient Age:    33 years       BP:           118/49 mmHg Patient Gender: F              HR:           78 bpm. Exam Location:  Inpatient Procedure: 2D Echo Indications:    elevated troponin  History:        Patient has prior history of Echocardiogram examinations, most                 recent 10/05/2019. Pacemaker.  Sonographer:    Johny Chess RDCS Referring Phys: [id number] [place]  1. Inferior and septal hypokinesis EF decreased since TTE done 10/05/19 . Left ventricular ejection fraction, by estimation, is 45 to 50%. The left ventricle has mildly  decreased function. The left ventricle has no regional wall motion abnormalities. The  left ventricular internal cavity size was moderately dilated. Left ventricular diastolic parameters are consistent with Grade II diastolic dysfunction (pseudonormalization). Elevated left ventricular end-diastolic pressure.  2. Pacing wires in RA/RV . Right ventricular systolic function is normal. The right ventricular size is normal.  3. Left atrial size was moderately dilated.  4. The pericardial effusion is posterior to the left ventricle.  5. The mitral valve is abnormal. Mild mitral valve regurgitation. No evidence of mitral stenosis. Moderate mitral annular calcification.  6. The aortic valve is tricuspid. Aortic valve regurgitation is moderate. Mild to moderate aortic valve sclerosis/calcification is present, without any evidence of aortic stenosis.  7. Aortic dilatation noted. There is mild dilatation of the ascending aorta, measuring 38 mm.  8. The inferior vena cava is normal in size with greater than 50%  respiratory variability, suggesting right atrial pressure of 3 mmHg. FINDINGS  Left Ventricle: Inferior and septal hypokinesis EF decreased since TTE done 10/05/19. Left ventricular ejection fraction, by estimation, is 45 to 50%. The left ventricle has mildly decreased function. The left ventricle has no regional wall motion abnormalities. The left ventricular internal cavity size was moderately dilated. There is no left ventricular hypertrophy. Left ventricular diastolic parameters are consistent with Grade II diastolic dysfunction (pseudonormalization). Elevated left ventricular end-diastolic pressure. Right Ventricle: Pacing wires in RA/RV. The right ventricular size is normal. No increase in right ventricular wall thickness. Right ventricular systolic function is normal. Left Atrium: Left atrial size was moderately dilated. Right Atrium: Right atrial size was normal in size. Pericardium: Trivial pericardial effusion is present. The pericardial effusion is posterior to the left ventricle. Mitral Valve: The mitral valve is abnormal. There is mild thickening of the mitral valve leaflet(s). There is mild calcification of the mitral valve leaflet(s). Moderate mitral annular calcification. Mild mitral valve regurgitation. No evidence of mitral  valve stenosis. Tricuspid Valve: The tricuspid valve is normal in structure. Tricuspid valve regurgitation is mild . No evidence of tricuspid stenosis. Aortic Valve: The aortic valve is tricuspid. Aortic valve regurgitation is moderate. Mild to moderate aortic valve sclerosis/calcification is present, without any evidence of aortic stenosis. Pulmonic Valve: The pulmonic valve was normal in structure. Pulmonic valve regurgitation is not visualized. No evidence of pulmonic stenosis. Aorta: Aortic dilatation noted. There is mild dilatation of the ascending aorta, measuring 38 mm. Venous: The inferior vena cava is normal in size with greater than 50% respiratory variability,  suggesting right atrial pressure of 3 mmHg. IAS/Shunts: No atrial level shunt detected by color flow Doppler. Additional Comments: A device lead is visualized.  LEFT VENTRICLE PLAX 2D LVIDd:         4.70 cm      Diastology LVIDs:         4.30 cm      LV e' medial:    3.81 cm/s LV PW:         1.10 cm      LV E/e' medial:  21.5 LV IVS:        1.10 cm      LV e' lateral:   5.11 cm/s LVOT diam:     2.10 cm      LV E/e' lateral: 16.0 LV SV:         43 LV SV Index:   28 LVOT Area:     3.46 cm  LV Volumes (MOD) LV vol d, MOD A2C: 102.0 ml LV vol s, MOD  A2C: 62.7 ml LV SV MOD A2C:     39.3 ml RIGHT VENTRICLE            IVC RV S prime:     8.16 cm/s  IVC diam: 1.00 cm TAPSE (M-mode): 1.6 cm LEFT ATRIUM             Index        RIGHT ATRIUM           Index LA diam:        4.30 cm 2.77 cm/m   RA Area:     11.70 cm LA Vol (A2C):   70.2 ml 45.26 ml/m  RA Volume:   25.40 ml  16.38 ml/m LA Vol (A4C):   35.1 ml 22.63 ml/m LA Biplane Vol: 52.3 ml 33.72 ml/m  AORTIC VALVE LVOT Vmax:   64.30 cm/s LVOT Vmean:  39.900 cm/s LVOT VTI:    0.125 m  AORTA Ao Root diam: 2.70 cm Ao Asc diam:  3.80 cm MITRAL VALVE MV Area (PHT): 4.21 cm    SHUNTS MV Decel Time: 180 msec    Systemic VTI:  0.12 m MV E velocity: 81.90 cm/s  Systemic Diam: 2.10 cm MV A velocity: 96.10 cm/s MV E/A ratio:  0.85 Jenkins Rouge MD Electronically signed by Jenkins Rouge MD Signature Date/Time: 05/23/2021/5:38:56 PM    Final    CUP PACEART REMOTE DEVICE CHECK  Result Date: 05/22/2021 Scheduled remote reviewed. Normal device function.  2 PAT (short), 1 NSVT, 24 beats on 02/24/21. Routing for further review per protocol Next remote 91 days- JBox, RN/CVRS    Subjective: No acute issues or events overnight denies nausea vomiting diarrhea constipation headache fevers chills or chest pain   Discharge Exam: Vitals:   05/23/21 1955 05/24/21 0311  BP: (!) 147/80 (!) 159/74  Pulse: 72 65  Resp: 18 17  Temp: 97.9 F (36.6 C) (!) 97.5 F (36.4 C)  SpO2: 99%  100%   Vitals:   05/23/21 1200 05/23/21 1955 05/24/21 0200 05/24/21 0311  BP: (!) 118/49 (!) 147/80  (!) 159/74  Pulse: 60 72  65  Resp:  18  17  Temp: (!) 97.5 F (36.4 C) 97.9 F (36.6 C)  (!) 97.5 F (36.4 C)  TempSrc: Oral Oral  Oral  SpO2: 100% 99%  100%  Weight:   59.2 kg   Height:        General: Pt is alert, awake, not in acute distress Cardiovascular: RRR, S1/S2 +, no rubs, no gallops Respiratory: CTA bilaterally, no wheezing, no rhonchi Abdominal: Soft, NT, ND, bowel sounds + Extremities: no edema, no cyanosis    The results of significant diagnostics from this hospitalization (including imaging, microbiology, ancillary and laboratory) are listed below for reference.     Microbiology: Recent Results (from the past 240 hour(s))  Resp Panel by RT-PCR (Flu A&B, Covid) Nasopharyngeal Swab     Status: None   Collection Time: 05/22/21  5:04 PM   Specimen: Nasopharyngeal Swab; Nasopharyngeal(NP) swabs in vial transport medium  Result Value Ref Range Status   SARS Coronavirus 2 by RT PCR NEGATIVE NEGATIVE Final    Comment: (NOTE) SARS-CoV-2 target nucleic acids are NOT DETECTED.  The SARS-CoV-2 RNA is generally detectable in upper respiratory specimens during the acute phase of infection. The lowest concentration of SARS-CoV-2 viral copies this assay can detect is 138 copies/mL. A negative result does not preclude SARS-Cov-2 infection and should not be used as the sole basis for treatment or other  patient management decisions. A negative result may occur with  improper specimen collection/handling, submission of specimen other than nasopharyngeal swab, presence of viral mutation(s) within the areas targeted by this assay, and inadequate number of viral copies(<138 copies/mL). A negative result must be combined with clinical observations, patient history, and epidemiological information. The expected result is Negative.  Fact Sheet for Patients:   EntrepreneurPulse.com.au  Fact Sheet for Healthcare Providers:  IncredibleEmployment.be  This test is no t yet approved or cleared by the [place] FDA and  has been authorized for detection and/or diagnosis of SARS-CoV-2 by FDA under an Emergency Use Authorization (EUA). This EUA will remain  in effect (meaning this test can be used) for the duration of the COVID-19 declaration under Section 564(b)(1) of the Act, 21 U.S.C.section 360bbb-3(b)(1), unless the authorization is terminated  or revoked sooner.       Influenza A by PCR NEGATIVE NEGATIVE Final   Influenza B by PCR NEGATIVE NEGATIVE Final    Comment: (NOTE) The Xpert Xpress SARS-CoV-2/FLU/RSV plus assay is intended as an aid in the diagnosis of influenza from Nasopharyngeal swab specimens and should not be used as a sole basis for treatment. Nasal washings and aspirates are unacceptable for Xpert Xpress SARS-CoV-2/FLU/RSV testing.  Fact Sheet for Patients: EntrepreneurPulse.com.au  Fact Sheet for Healthcare Providers: IncredibleEmployment.be  This test is not yet approved or cleared by the [place] FDA and has been authorized for detection and/or diagnosis of SARS-CoV-2 by FDA under an Emergency Use Authorization (EUA). This EUA will remain in effect (meaning this test can be used) for the duration of the COVID-19 declaration under Section 564(b)(1) of the Act, 21 U.S.C. section 360bbb-3(b)(1), unless the authorization is terminated or revoked.  Performed at [facility], [place] [address] [place], [place] [address]      Labs: BNP (last 3 results) No results for input(s): BNP in the last 8760 hours. Basic Metabolic Panel: Recent Labs  Lab 05/22/21 1411  NA 133*  K 4.6  CL 100  CO2 21*  GLUCOSE 152*  BUN 16  CREATININE 0.89  CALCIUM 8.8*   Liver Function Tests: Recent Labs  Lab 05/22/21 1411  AST 33  ALT 15   ALKPHOS 61  BILITOT 0.7  PROT 6.4*  ALBUMIN 3.7   No results for input(s): LIPASE, AMYLASE in the last 168 hours. No results for input(s): AMMONIA in the last 168 hours. CBC: Recent Labs  Lab 05/22/21 1411  WBC 6.2  NEUTROABS 4.4  HGB 13.9  HCT 43.5  MCV 95.8  PLT 204   Cardiac Enzymes: No results for input(s): CKTOTAL, CKMB, CKMBINDEX, TROPONINI in the last 168 hours. BNP: Invalid input(s): POCBNP CBG: No results for input(s): GLUCAP in the last 168 hours. D-Dimer No results for input(s): DDIMER in the last 72 hours. Hgb A1c No results for input(s): HGBA1C in the last 72 hours. Lipid Profile No results for input(s): CHOL, HDL, LDLCALC, TRIG, CHOLHDL, LDLDIRECT in the last 72 hours. Thyroid function studies No results for input(s): TSH, T4TOTAL, T3FREE, THYROIDAB in the last 72 hours.  Invalid input(s): FREET3 Anemia work up No results for input(s): VITAMINB12, FOLATE, FERRITIN, TIBC, IRON, RETICCTPCT in the last 72 hours. Urinalysis    Component Value Date/Time   COLORURINE YELLOW 05/22/2021 1600   APPEARANCEUR CLEAR 05/22/2021 1600   LABSPEC 1.015 05/22/2021 1600   PHURINE 6.0 05/22/2021 1600   GLUCOSEU NEGATIVE 05/22/2021 1600   HGBUR TRACE (A) 05/22/2021 1600   BILIRUBINUR NEGATIVE 05/22/2021 1600  KETONESUR NEGATIVE 05/22/2021 1600   PROTEINUR NEGATIVE 05/22/2021 1600   NITRITE NEGATIVE 05/22/2021 1600   LEUKOCYTESUR NEGATIVE 05/22/2021 1600   Sepsis Labs Invalid input(s): PROCALCITONIN,  WBC,  LACTICIDVEN Microbiology Recent Results (from the past 240 hour(s))  Resp Panel by RT-PCR (Flu A&B, Covid) Nasopharyngeal Swab     Status: None   Collection Time: 05/22/21  5:04 PM   Specimen: Nasopharyngeal Swab; Nasopharyngeal(NP) swabs in vial transport medium  Result Value Ref Range Status   SARS Coronavirus 2 by RT PCR NEGATIVE NEGATIVE Final    Comment: (NOTE) SARS-CoV-2 target nucleic acids are NOT DETECTED.  The SARS-CoV-2 RNA is generally  detectable in upper respiratory specimens during the acute phase of infection. The lowest concentration of SARS-CoV-2 viral copies this assay can detect is 138 copies/mL. A negative result does not preclude SARS-Cov-2 infection and should not be used as the sole basis for treatment or other patient management decisions. A negative result may occur with  improper specimen collection/handling, submission of specimen other than nasopharyngeal swab, presence of viral mutation(s) within the areas targeted by this assay, and inadequate number of viral copies(<138 copies/mL). A negative result must be combined with clinical observations, patient history, and epidemiological information. The expected result is Negative.  Fact Sheet for Patients:  EntrepreneurPulse.com.au  Fact Sheet for Healthcare Providers:  IncredibleEmployment.be  This test is no t yet approved or cleared by the [place] FDA and  has been authorized for detection and/or diagnosis of SARS-CoV-2 by FDA under an Emergency Use Authorization (EUA). This EUA will remain  in effect (meaning this test can be used) for the duration of the COVID-19 declaration under Section 564(b)(1) of the Act, 21 U.S.C.section 360bbb-3(b)(1), unless the authorization is terminated  or revoked sooner.       Influenza A by PCR NEGATIVE NEGATIVE Final   Influenza B by PCR NEGATIVE NEGATIVE Final    Comment: (NOTE) The Xpert Xpress SARS-CoV-2/FLU/RSV plus assay is intended as an aid in the diagnosis of influenza from Nasopharyngeal swab specimens and should not be used as a sole basis for treatment. Nasal washings and aspirates are unacceptable for Xpert Xpress SARS-CoV-2/FLU/RSV testing.  Fact Sheet for Patients: EntrepreneurPulse.com.au  Fact Sheet for Healthcare Providers: IncredibleEmployment.be  This test is not yet approved or cleared by the [place] FDA  and has been authorized for detection and/or diagnosis of SARS-CoV-2 by FDA under an Emergency Use Authorization (EUA). This EUA will remain in effect (meaning this test can be used) for the duration of the COVID-19 declaration under Section 564(b)(1) of the Act, 21 U.S.C. section 360bbb-3(b)(1), unless the authorization is terminated or revoked.  Performed at [facility], [place] [address], [place], [place] [address]      Time coordinating discharge: Over 30 minutes  SIGNED:   Little Ishikawa, DO [facility] 05/24/2021, 7:22 AM Pager   If 7PM-7AM, please contact night-coverage [email or web address]

## 2021-05-24 NOTE — Progress Notes (Signed)
Pt refusing the tele to be placed. Put on standby till pt allows to.

## 2021-05-25 DIAGNOSIS — E785 Hyperlipidemia, unspecified: Secondary | ICD-10-CM | POA: Diagnosis not present

## 2021-05-25 DIAGNOSIS — R600 Localized edema: Secondary | ICD-10-CM | POA: Diagnosis not present

## 2021-05-25 DIAGNOSIS — I214 Non-ST elevation (NSTEMI) myocardial infarction: Secondary | ICD-10-CM | POA: Diagnosis not present

## 2021-05-25 DIAGNOSIS — S0990XA Unspecified injury of head, initial encounter: Secondary | ICD-10-CM | POA: Diagnosis not present

## 2021-05-25 DIAGNOSIS — G309 Alzheimer's disease, unspecified: Secondary | ICD-10-CM | POA: Diagnosis not present

## 2021-05-25 DIAGNOSIS — S0101XA Laceration without foreign body of scalp, initial encounter: Secondary | ICD-10-CM | POA: Diagnosis not present

## 2021-05-30 DIAGNOSIS — G301 Alzheimer's disease with late onset: Secondary | ICD-10-CM | POA: Diagnosis not present

## 2021-05-30 DIAGNOSIS — R9430 Abnormal result of cardiovascular function study, unspecified: Secondary | ICD-10-CM | POA: Diagnosis not present

## 2021-05-30 DIAGNOSIS — I10 Essential (primary) hypertension: Secondary | ICD-10-CM | POA: Diagnosis not present

## 2021-05-30 DIAGNOSIS — Z79899 Other long term (current) drug therapy: Secondary | ICD-10-CM | POA: Diagnosis not present

## 2021-05-30 DIAGNOSIS — I214 Non-ST elevation (NSTEMI) myocardial infarction: Secondary | ICD-10-CM | POA: Diagnosis not present

## 2021-05-30 DIAGNOSIS — W19XXXA Unspecified fall, initial encounter: Secondary | ICD-10-CM | POA: Diagnosis not present

## 2021-05-31 NOTE — Progress Notes (Signed)
Remote pacemaker transmission.   

## 2021-06-04 DIAGNOSIS — B351 Tinea unguium: Secondary | ICD-10-CM | POA: Diagnosis not present

## 2021-06-04 DIAGNOSIS — M2012 Hallux valgus (acquired), left foot: Secondary | ICD-10-CM | POA: Diagnosis not present

## 2021-06-13 ENCOUNTER — Other Ambulatory Visit: Payer: Self-pay

## 2021-06-13 ENCOUNTER — Emergency Department (HOSPITAL_COMMUNITY): Payer: Medicare Other

## 2021-06-13 ENCOUNTER — Encounter (HOSPITAL_COMMUNITY): Payer: Self-pay

## 2021-06-13 ENCOUNTER — Emergency Department (HOSPITAL_COMMUNITY)
Admission: EM | Admit: 2021-06-13 | Discharge: 2021-06-14 | Disposition: A | Discharge status: Home / Self Care | Payer: Medicare Other | Attending: Emergency Medicine | Admitting: Emergency Medicine

## 2021-06-13 DIAGNOSIS — Z950 Presence of cardiac pacemaker: Secondary | ICD-10-CM | POA: Insufficient documentation

## 2021-06-13 DIAGNOSIS — G309 Alzheimer's disease, unspecified: Secondary | ICD-10-CM | POA: Insufficient documentation

## 2021-06-13 DIAGNOSIS — M545 Low back pain, unspecified: Secondary | ICD-10-CM | POA: Insufficient documentation

## 2021-06-13 HISTORY — DX: Dementia in other diseases classified elsewhere, unspecified severity, without behavioral disturbance, psychotic disturbance, mood disturbance, and anxiety: F02.80

## 2021-06-13 LAB — BASIC METABOLIC PANEL
Anion gap: 11 (ref 5–15)
BUN: 15 mg/dL (ref 8–23)
CO2: 27 mmol/L (ref 22–32)
Calcium: 9.4 mg/dL (ref 8.9–10.3)
Chloride: 99 mmol/L (ref 98–111)
Creatinine, Ser: 0.58 mg/dL (ref 0.44–1.00)
GFR, Estimated: >60 mL/min (ref >60)
Glucose, Bld: 124 mg/dL — ABNORMAL HIGH (ref 70–99)
Potassium: 3.7 mmol/L (ref 3.5–5.1)
Sodium: 137 mmol/L (ref 135–145)

## 2021-06-13 LAB — CBC WITH DIFFERENTIAL/PLATELET
Abs Immature Granulocytes: 0.09 10*3/uL — ABNORMAL HIGH (ref 0.00–0.07)
Basophils Absolute: 0 10*3/uL (ref 0.0–0.1)
Basophils Relative: 0 %
Eosinophils Absolute: 0 10*3/uL (ref 0.0–0.5)
Eosinophils Relative: 0 %
HCT: 38.9 % (ref 36.0–46.0)
Hemoglobin: 12.9 g/dL (ref 12.0–15.0)
Immature Granulocytes: 1 %
Lymphocytes Relative: 22 %
Lymphs Abs: 2.1 10*3/uL (ref 0.7–4.0)
MCH: 30.6 pg (ref 26.0–34.0)
MCHC: 33.2 g/dL (ref 30.0–36.0)
MCV: 92.4 fL (ref 80.0–100.0)
Monocytes Absolute: 0.9 10*3/uL (ref 0.1–1.0)
Monocytes Relative: 9 %
Neutro Abs: 6.7 10*3/uL (ref 1.7–7.7)
Neutrophils Relative %: 68 %
Platelets: 261 10*3/uL (ref 150–400)
RBC: 4.21 MIL/uL (ref 3.87–5.11)
RDW: 13.4 % (ref 11.5–15.5)
WBC: 9.9 10*3/uL (ref 4.0–10.5)
nRBC: 0 % (ref 0.0–0.2)

## 2021-06-13 MED ORDER — HYDROCODONE-ACETAMINOPHEN 5-325 MG PO TABS
1.0000 | ORAL_TABLET | Freq: Once | ORAL | Status: AC
  Administered 2021-06-14: 1 via ORAL
  Filled 2021-06-13: qty 1

## 2021-06-13 NOTE — ED Provider Notes (Signed)
Emergency Medicine Provider Triage Evaluation Note  Destinae Neubecker , a 85 y.o. female  was evaluated in triage.  Pt complains of low back pain.  Son is at bedside and unsure when back pain started.  Patient had a fall on 10/11 and evaluated at [facility] where she was admitted due to elevated troponins.  CT head/cervical spine negative at that time.  Patient unsure whether or not she had has any falls since.  Patient has a history of dementia.  No fever or chills.  Patient denies abdominal pain.  No urinary symptoms.  Son notes similar symptoms happened previously where patient had a fracture in her vertebrae.  Review of Systems  Positive: Back pain Negative: fever  Physical Exam  BP (!) 121/100 (BP Location: Right Arm)   Pulse 73   Temp 98.3 F (36.8 C) (Oral)   Resp 16   SpO2 97%  Gen:   Awake, no distress   Resp:  Normal effort  MSK:   Moves extremities without difficulty  Other:    Medical Decision Making  Medically screening exam initiated at 3:10 PM.  Appropriate orders placed.  Madisynn Lavoy was informed that the remainder of the evaluation will be completed by another provider, this initial triage assessment does not replace that evaluation, and the importance of remaining in the ED until their evaluation is complete.  CT lumbar spine Labs/UA due to unclear etiology of back pain   Suzy Bouchard, PA-C 06/13/21 1512    Fredia Sorrow, MD 06/29/21 [phone]

## 2021-06-13 NOTE — ED Triage Notes (Signed)
Pt c/o severe back pain. Pt does not remember any falls/injury. Pt seen for a fall 10/11 at [facility]. Pt brought in today by son. Pt son states pt walks unassisted at baseline, not currently able to walk upright. Pt son states pt has Alzheimer, and is confused at baseline.

## 2021-06-13 NOTE — ED Provider Notes (Signed)
[facility] DEPT Provider Note   CSN: [id number] Arrival date & time: 06/13/21  1351     History Chief Complaint  Patient presents with   Back Pain    Ikeisha Blumberg is a 85 y.o. female.  Patient is an 85 year old female with past medical history of pacemaker insertion, TIA, Alzheimer's dementia.  Patient brought by son for evaluation of back pain.  Son tells me he saw her on Sunday and she seemed normal.  When he went to check on her today, she was complaining of back pain and walking "hunched over".  Patient does not recall any specific injury or fall.  She denies any weakness or numbness of the legs.  She denies any bowel or bladder complaints.  Pain is worse when she attempts to ambulate and relieved with rest.  The history is provided by the patient.      Past Medical History:  Diagnosis Date   Alzheimer disease ([place])    Broken foot 2017   Chicken pox    Syncope    Related to BP medication    TIA (transient ischemic attack)    UTI (urinary tract infection)     Patient Active Problem List   Diagnosis Date Noted   NSTEMI (non-ST elevated myocardial infarction) ([place]) 05/22/2021   Fall at home, initial encounter 05/22/2021   Pacemaker 04/27/2018   Memory change 01/28/2018   Adjustment disorder with depressed mood 01/28/2018   TIA (transient ischemic attack) 01/28/2018    Past Surgical History:  Procedure Laterality Date   ABDOMINAL HYSTERECTOMY     APPENDECTOMY     PACEMAKER GENERATOR CHANGE  11/25/2016   [employer] L 101 generator change performed in [facility] in [place]  12/24/2008   MDT PPM implanted by Dr Joeseph Amor in [place]      OB History   No obstetric history on file.     Family History  Problem Relation Age of Onset   Kidney disease Father     Social History   Tobacco Use   Smoking status: Never   Smokeless tobacco: Never  Vaping Use   Vaping Use: Never used  Substance Use  Topics   Alcohol use: Never   Drug use: Never    Home Medications Prior to Admission medications   Medication Sig Start Date End Date Taking? Authorizing Provider  alendronate (FOSAMAX) 70 MG tablet Take 70 mg by mouth once a week. Take on Fridays 04/17/21   [provider]  atorvastatin (LIPITOR) 10 MG tablet TAKE 1 TABLET BY MOUTH  DAILY Patient taking differently: Take 10 mg by mouth daily. 05/25/19   Luetta Nutting, DO  calcium-vitamin D (OSCAL WITH D) 500-200 MG-UNIT tablet Take 1 tablet by mouth every evening. With dinner    [provider]  Cholecalciferol (VITAMIN D) 125 MCG (5000 UT) CAPS Take 1 capsule by mouth daily. 05/24/21   Little Ishikawa, MD  clopidogrel (PLAVIX) 75 MG tablet TAKE 1 TABLET BY MOUTH  DAILY Patient taking differently: Take 75 mg by mouth daily. 02/04/19   Luetta Nutting, DO  divalproex (DEPAKOTE SPRINKLE) 125 MG capsule Take 125 mg by mouth daily as needed. 05/22/21   [provider]  donepezil (ARICEPT) 10 MG tablet Take 10 mg by mouth daily. 05/22/21   [provider]  furosemide (LASIX) 40 MG tablet Take 1 tablet (40 mg total) by mouth daily. 11/23/18   Luetta Nutting, DO  melatonin 5 MG TABS Take  5 mg by mouth at bedtime.    [provider]  memantine (NAMENDA) 10 MG tablet Take 10 mg by mouth 2 (two) times daily. 05/22/21   [provider]  Multiple Vitamins-Minerals (CENTRUM ADULTS) TABS Take 1 tablet by mouth daily.    [provider]  potassium chloride SA (K-DUR,KLOR-CON) 20 MEQ tablet Take 1 tablet (20 mEq total) by mouth daily. 11/23/18   Luetta Nutting, DO  vitamin B-12 (CYANOCOBALAMIN) 1000 MCG tablet Take 1,000 mcg by mouth in the morning and at bedtime.    [provider]    Allergies    Patient has no known allergies.  Review of Systems   Review of Systems  All other systems reviewed and are negative.  Physical Exam Updated Vital Signs BP 137/63 (BP Location: Left  Arm)   Pulse 62   Temp 98.3 F (36.8 C) (Oral)   Resp 16   SpO2 100%   Physical Exam Vitals and nursing note reviewed.  Constitutional:      General: She is not in acute distress.    Appearance: She is well-developed. She is not diaphoretic.  HENT:     Head: Normocephalic and atraumatic.  Cardiovascular:     Rate and Rhythm: Normal rate and regular rhythm.     Heart sounds: No murmur heard.   No friction rub. No gallop.  Pulmonary:     Effort: Pulmonary effort is normal. No respiratory distress.     Breath sounds: Normal breath sounds. No wheezing.  Abdominal:     General: Bowel sounds are normal. There is no distension.     Palpations: Abdomen is soft.     Tenderness: There is no abdominal tenderness.  Musculoskeletal:        General: Normal range of motion.     Cervical back: Normal range of motion and neck supple.  Skin:    General: Skin is warm and dry.  Neurological:     General: No focal deficit present.     Mental Status: She is alert and oriented to person, place, and time.     Comments: DTRs are 1+ and symmetrical in the patellar and Achilles tendons bilaterally.  Strength is 5 out of 5 in both lower extremities.  She is able to ambulate with antalgic gait.    ED Results / Procedures / Treatments   Labs (all labs ordered are listed, but only abnormal results are displayed) Labs Reviewed  CBC WITH DIFFERENTIAL/PLATELET - Abnormal; Notable for the following components:      Result Value   Abs Immature Granulocytes 0.09 (*)    All other components within normal limits  BASIC METABOLIC PANEL - Abnormal; Notable for the following components:   Glucose, Bld 124 (*)    All other components within normal limits  URINALYSIS, ROUTINE W REFLEX MICROSCOPIC    EKG None  Radiology CT Lumbar Spine Wo Contrast  Result Date: 06/13/2021 CLINICAL DATA:  Low back pain, trauma. EXAM: CT LUMBAR SPINE WITHOUT CONTRAST TECHNIQUE: Multidetector CT imaging of the lumbar spine  was performed without intravenous contrast administration. Multiplanar CT image reconstructions were also generated. COMPARISON:  Lumbar spine CT 11/10/2019 FINDINGS: Segmentation: 5 lumbar type vertebrae. Alignment: Slight lumbar levoscoliosis. Unchanged grade 1 anterolisthesis of L4 on L5. Vertebrae: Chronic L3 superior endplate compression fracture with mild-to-moderate vertebral body height loss anteriorly, mildly progressed from the prior CT where the fracture was recent. No new fracture or suspicious osseous lesion. Paraspinal and other soft tissues: New small right  pleural effusion. Pacemaker leads in the right atrium and right ventricle. Abdominal aortic atherosclerosis without aneurysm. 2.5 cm hypodensity in the left hepatic lobe compatible with a cyst. Disc levels: Chronic severe disc space narrowing at L3-4 and L5-S1 with milder narrowing at L2-3 and L4-5. Vacuum disc at each of these levels. Degenerative endplate sclerosis at V4-2 and L5-S1. T11-12: Mild disc bulging and moderate facet arthrosis result in moderate right neural foraminal stenosis without evidence of significant spinal stenosis, unchanged. T12-L1: Moderate to severe right and mild-to-moderate left facet arthrosis without evidence of stenosis, unchanged. L1-2: Moderate right and mild left facet arthrosis without evidence of stenosis, unchanged. L2-3: Disc bulging, mild retropulsion of the L3 superior endplate, endplate spurring, and moderate facet and ligamentum flavum hypertrophy result in moderate to severe spinal stenosis and moderate right greater than left neural foraminal stenosis, stable to mildly progressed. L3-4: Disc bulging and moderate facet and ligamentum flavum hypertrophy result in mild spinal stenosis and mild right greater than left neural foraminal stenosis, unchanged. L4-5: Anterolisthesis with bulging uncovered disc and moderate to severe facet hypertrophy result in mild spinal stenosis and mild right neural foraminal  stenosis, unchanged. L5-S1: Disc bulging, endplate spurring, and severe right and moderate left facet arthrosis result in mild spinal stenosis and moderate bilateral neural foraminal stenosis, unchanged. IMPRESSION: 1. No acute osseous abnormality. Chronic L3 compression fracture. 2. Similar appearance of lumbar disc and facet degeneration, most notable at L2-3 where there is moderate to severe spinal stenosis and moderate neural foraminal stenosis. 3. New small right pleural effusion. 4. Aortic Atherosclerosis (ICD10-I70.0). Electronically Signed   By: Logan Bores M.D.   On: 06/13/2021 15:56    Procedures Procedures   Medications Ordered in ED Medications  HYDROcodone-acetaminophen (NORCO/VICODIN) 5-325 MG per tablet 1 tablet (has no administration in time range)    ED Course  I have reviewed the triage vital signs and the nursing notes.  Pertinent labs & imaging results that were available during my care of the patient were reviewed by me and considered in my medical decision making (see chart for details).    MDM Rules/Calculators/A&P  Patient presenting here with complaints of low back pain that seems musculoskeletal in nature.  She has findings today of chronic L3 compression fracture and multilevel disc disease.  She has no obvious surgical finding on CT and her strength and reflexes are symmetrical.  She has no bowel or bladder issues or other red flags that would suggest an emergent situation.  Patient feeling better after receiving hydrocodone.  I feel as though patient can safely be discharged with diagnosis of what appears to be musculoskeletal back pain and follow-up with primary doctor.  Final Clinical Impression(s) / ED Diagnoses Final diagnoses:  None    Rx / DC Orders ED Discharge Orders     None        Veryl Speak, MD 06/14/21 [phone]

## 2021-06-14 DIAGNOSIS — M5136 Other intervertebral disc degeneration, lumbar region: Secondary | ICD-10-CM | POA: Diagnosis not present

## 2021-06-14 DIAGNOSIS — I214 Non-ST elevation (NSTEMI) myocardial infarction: Secondary | ICD-10-CM | POA: Diagnosis not present

## 2021-06-14 DIAGNOSIS — M5134 Other intervertebral disc degeneration, thoracic region: Secondary | ICD-10-CM | POA: Diagnosis not present

## 2021-06-14 DIAGNOSIS — M545 Low back pain, unspecified: Secondary | ICD-10-CM | POA: Diagnosis not present

## 2021-06-14 DIAGNOSIS — R29898 Other symptoms and signs involving the musculoskeletal system: Secondary | ICD-10-CM | POA: Diagnosis not present

## 2021-06-14 DIAGNOSIS — M5030 Other cervical disc degeneration, unspecified cervical region: Secondary | ICD-10-CM | POA: Diagnosis not present

## 2021-06-14 LAB — URINALYSIS, ROUTINE W REFLEX MICROSCOPIC
Bacteria, UA: NONE SEEN
Bilirubin Urine: NEGATIVE
Glucose, UA: NEGATIVE mg/dL
Ketones, ur: 5 mg/dL — AB
Nitrite: NEGATIVE
Protein, ur: NEGATIVE mg/dL
Specific Gravity, Urine: 1.014 (ref 1.005–1.030)
pH: 5 (ref 5.0–8.0)

## 2021-06-14 MED ORDER — HYDROCODONE-ACETAMINOPHEN 5-325 MG PO TABS: 1.0000 | ORAL_TABLET | ORAL | 0 refills | Status: DC | PRN

## 2021-06-14 NOTE — Discharge Instructions (Signed)
Take hydrocodone as prescribed as needed for pain.  Rest.  Follow-up with primary doctor if symptoms are not improving in the next week, and return to the ER if symptoms significantly worsen or change.

## 2021-07-10 DIAGNOSIS — I10 Essential (primary) hypertension: Secondary | ICD-10-CM | POA: Diagnosis not present

## 2021-07-10 DIAGNOSIS — Z79899 Other long term (current) drug therapy: Secondary | ICD-10-CM | POA: Diagnosis not present

## 2021-07-10 DIAGNOSIS — G301 Alzheimer's disease with late onset: Secondary | ICD-10-CM | POA: Diagnosis not present

## 2021-07-10 DIAGNOSIS — Z0000 Encounter for general adult medical examination without abnormal findings: Secondary | ICD-10-CM | POA: Diagnosis not present

## 2021-07-10 DIAGNOSIS — E7800 Pure hypercholesterolemia, unspecified: Secondary | ICD-10-CM | POA: Diagnosis not present

## 2021-07-10 DIAGNOSIS — Z23 Encounter for immunization: Secondary | ICD-10-CM | POA: Diagnosis not present

## 2021-07-20 ENCOUNTER — Encounter: Payer: Medicare Other | Admitting: Internal Medicine

## 2021-07-20 DIAGNOSIS — I495 Sick sinus syndrome: Secondary | ICD-10-CM

## 2021-07-20 DIAGNOSIS — R55 Syncope and collapse: Secondary | ICD-10-CM

## 2021-07-20 DIAGNOSIS — G459 Transient cerebral ischemic attack, unspecified: Secondary | ICD-10-CM

## 2021-08-02 DIAGNOSIS — F5101 Primary insomnia: Secondary | ICD-10-CM | POA: Diagnosis not present

## 2021-08-02 DIAGNOSIS — F39 Unspecified mood [affective] disorder: Secondary | ICD-10-CM | POA: Diagnosis not present

## 2021-08-02 DIAGNOSIS — F0390 Unspecified dementia without behavioral disturbance: Secondary | ICD-10-CM | POA: Diagnosis not present

## 2021-08-02 DIAGNOSIS — F413 Other mixed anxiety disorders: Secondary | ICD-10-CM | POA: Diagnosis not present

## 2021-08-10 ENCOUNTER — Encounter: Payer: Medicare Other | Admitting: Internal Medicine

## 2021-08-10 ENCOUNTER — Ambulatory Visit (INDEPENDENT_AMBULATORY_CARE_PROVIDER_SITE_OTHER): Payer: Medicare Other | Admitting: Internal Medicine

## 2021-08-10 ENCOUNTER — Encounter: Payer: Self-pay | Admitting: Internal Medicine

## 2021-08-10 ENCOUNTER — Other Ambulatory Visit: Payer: Self-pay

## 2021-08-10 VITALS — BP 106/58 | HR 62 | Ht 66.0 in | Wt 136.0 lb

## 2021-08-10 DIAGNOSIS — R55 Syncope and collapse: Secondary | ICD-10-CM | POA: Diagnosis not present

## 2021-08-10 DIAGNOSIS — I495 Sick sinus syndrome: Secondary | ICD-10-CM | POA: Diagnosis not present

## 2021-08-10 DIAGNOSIS — G459 Transient cerebral ischemic attack, unspecified: Secondary | ICD-10-CM

## 2021-08-10 NOTE — Patient Instructions (Addendum)
Medication Instructions:  Your physician recommends that you continue on your current medications as directed. Please refer to the Current Medication list given to you today. *If you need a refill on your cardiac medications before your next appointment, please call your pharmacy*  Lab Work: None. If you have labs (blood work) drawn today and your tests are completely normal, you will receive your results only by: [place] (if you have MyChart) OR A paper copy in the mail If you have any lab test that is abnormal or we need to change your treatment, we will call you to review the results.  Testing/Procedures: None.  Follow-Up: At [facility], you and your health needs are our priority.  As part of our continuing mission to provide you with exceptional heart care, we have created designated Provider Care Teams.  These Care Teams include your primary Cardiologist (physician) and Advanced Practice Providers (APPs -  Physician Assistants and Nurse Practitioners) who all work together to provide you with the care you need, when you need it.  Your physician wants you to follow-up in: 12 months with   one of the following Advanced Practice Providers on your designated Care Team:    Tommye Standard, PA-C    You will receive a reminder letter in the mail two months in advance. If you don't receive a letter, please call our office to schedule the follow-up appointment.  Remote monitoring is used to monitor your Pacemaker from home. This monitoring reduces the number of office visits required to check your device to one time per year. It allows [place] to keep an eye on the functioning of your device to ensure it is working properly. You are scheduled for a device check from home on 08/21/21. You may send your transmission at any time that day. If you have a wireless device, the transmission will be sent automatically. After your physician reviews your transmission, you will receive a postcard with your  next transmission date.  We recommend signing up for the patient portal called "MyChart".  Sign up information is provided on this After Visit Summary.  MyChart is used to connect with patients for Virtual Visits (Telemedicine).  Patients are able to view lab/test results, encounter notes, upcoming appointments, etc.  Non-urgent messages can be sent to your provider as well.   To learn more about what you can do with MyChart, go to [email or web address].    Any Other Special Instructions Will Be Listed Below (If Applicable).

## 2021-08-10 NOTE — Progress Notes (Signed)
PCP: Lajean Manes, MD   Primary EP:  Dr Pilar Plate is a 85 y.o. female who presents today for routine electrophysiology followup.  Since last being seen in our clinic, the patient reports doing very well.  Today, she denies symptoms of palpitations, chest pain, shortness of breath,  lower extremity edema, dizziness, presyncope, or syncope.  The patient is otherwise without complaint today.   Past Medical History:  Diagnosis Date   Alzheimer disease ([place])    Broken foot 2017   Chicken pox    Syncope    Related to BP medication    TIA (transient ischemic attack)    UTI (urinary tract infection)    Past Surgical History:  Procedure Laterality Date   ABDOMINAL HYSTERECTOMY     APPENDECTOMY     PACEMAKER GENERATOR CHANGE  11/25/2016   [employer] L 101 generator change performed in [facility] in [place]  12/24/2008   MDT PPM implanted by Dr Joeseph Amor in [place]- all systems are reviewed and negative except as per HPI above  Current Outpatient Medications  Medication Sig Dispense Refill   alendronate (FOSAMAX) 70 MG tablet Take 70 mg by mouth once a week. Take on Fridays     atorvastatin (LIPITOR) 10 MG tablet TAKE 1 TABLET BY MOUTH  DAILY (Patient taking differently: Take 10 mg by mouth daily.) 90 tablet 3   calcium-vitamin D (OSCAL WITH D) 500-200 MG-UNIT tablet Take 1 tablet by mouth every evening. With dinner     Cholecalciferol (VITAMIN D) 125 MCG (5000 UT) CAPS Take 1 capsule by mouth daily. 30 capsule 0   clopidogrel (PLAVIX) 75 MG tablet TAKE 1 TABLET BY MOUTH  DAILY (Patient taking differently: Take 75 mg by mouth daily.) 90 tablet 0   divalproex (DEPAKOTE SPRINKLE) 125 MG capsule Take 125 mg by mouth daily as needed.     donepezil (ARICEPT) 10 MG tablet Take 10 mg by mouth daily.     furosemide (LASIX) 40 MG tablet Take 1 tablet (40 mg total) by mouth daily. 90 tablet 2   HYDROcodone-acetaminophen (NORCO) 5-325 MG  tablet Take 1 tablet by mouth every 4 (four) hours as needed. 15 tablet 0   melatonin 5 MG TABS Take 5 mg by mouth at bedtime.     memantine (NAMENDA) 10 MG tablet Take 10 mg by mouth 2 (two) times daily.     Multiple Vitamins-Minerals (CENTRUM ADULTS) TABS Take 1 tablet by mouth daily.     potassium chloride SA (K-DUR,KLOR-CON) 20 MEQ tablet Take 1 tablet (20 mEq total) by mouth daily. 90 tablet 2   vitamin B-12 (CYANOCOBALAMIN) 1000 MCG tablet Take 1,000 mcg by mouth in the morning and at bedtime.     No current facility-administered medications for this visit.    Physical Exam: Vitals:   08/10/21 1029  BP: (!) 106/58  Pulse: 62  SpO2: 95%  Weight: 136 lb (61.7 kg)  Height: 5\' 6"  (1.676 m)    GEN- The patient is elderly appearing, alert with flat affect.  Son assists with history Head- normocephalic, atraumatic Eyes-  Sclera clear, conjunctiva pink Ears- hearing intact Oropharynx- clear Lungs- Clear to ausculation bilaterally, normal work of breathing Chest- pacemaker pocket is well healed Heart- Regular rate and rhythm, no murmurs, rubs or gallops, PMI not laterally displaced GI- soft, NT, ND, + BS Extremities- no clubbing, cyanosis, or edema  Pacemaker interrogation- reviewed in detail today,  See PACEART report  ekg tracing ordered today is personally reviewed and shows sinus, LBBB  Assessment and Plan:  1. Symptomatic sinus bradycardia  Normal pacemaker function See Pace Art report No changes today she is not device dependant today  Return to see EP APP in a year  Thompson Grayer MD, [facility] 08/10/2021 10:48 AM

## 2021-08-21 ENCOUNTER — Ambulatory Visit (INDEPENDENT_AMBULATORY_CARE_PROVIDER_SITE_OTHER): Payer: Medicare Other

## 2021-08-21 DIAGNOSIS — I495 Sick sinus syndrome: Secondary | ICD-10-CM

## 2021-08-21 LAB — CUP PACEART REMOTE DEVICE CHECK
Battery Remaining Longevity: 54 mo
Battery Remaining Percentage: 90 %
Brady Statistic RA Percent Paced: 50 %
Brady Statistic RV Percent Paced: 0 %
Date Time Interrogation Session: 20230110023200
Implantable Lead Implant Date: 20100415
Implantable Lead Implant Date: 20100415
Implantable Lead Location: 753859
Implantable Lead Location: 753860
Implantable Lead Model: 5076
Implantable Lead Model: 5076
Implantable Pulse Generator Implant Date: 20180416
Lead Channel Impedance Value: 515 Ohm
Lead Channel Impedance Value: 549 Ohm
Lead Channel Pacing Threshold Amplitude: 0.4 V
Lead Channel Pacing Threshold Amplitude: 1.2 V
Lead Channel Pacing Threshold Pulse Width: 0.4 ms
Lead Channel Pacing Threshold Pulse Width: 0.4 ms
Lead Channel Setting Pacing Amplitude: 2 V
Lead Channel Setting Pacing Amplitude: 2.5 V
Lead Channel Setting Pacing Pulse Width: 0.4 ms
Lead Channel Setting Sensing Sensitivity: 2.5 mV
Pulse Gen Serial Number: 765776

## 2021-08-31 NOTE — Progress Notes (Signed)
Remote pacemaker transmission.   

## 2021-09-06 ENCOUNTER — Emergency Department (HOSPITAL_COMMUNITY): Payer: Medicare Other

## 2021-09-06 ENCOUNTER — Emergency Department (HOSPITAL_COMMUNITY)
Admission: EM | Admit: 2021-09-06 | Discharge: 2021-09-07 | Disposition: A | Discharge status: Home / Self Care | Payer: Medicare Other | Attending: Emergency Medicine | Admitting: Emergency Medicine

## 2021-09-06 DIAGNOSIS — S0003XA Contusion of scalp, initial encounter: Secondary | ICD-10-CM | POA: Diagnosis not present

## 2021-09-06 DIAGNOSIS — S0990XA Unspecified injury of head, initial encounter: Secondary | ICD-10-CM | POA: Diagnosis present

## 2021-09-06 DIAGNOSIS — W01198A Fall on same level from slipping, tripping and stumbling with subsequent striking against other object, initial encounter: Secondary | ICD-10-CM | POA: Diagnosis not present

## 2021-09-06 DIAGNOSIS — F0390 Unspecified dementia without behavioral disturbance: Secondary | ICD-10-CM | POA: Insufficient documentation

## 2021-09-06 NOTE — ED Notes (Addendum)
Pt arrived via [facility] for cc of level 2 fall on thinner (plavix) from [place], unwitnessed. Pt reports slip and fall in kitchen backward. Laceration noted to back of head. Bleeding controlled with pressure dressing. Per facility pt oriented to baseline per facility, A&Ox2. Pt refused Ccollar.  124/54 191 97% 63

## 2021-09-06 NOTE — ED Provider Notes (Signed)
[facility] EMERGENCY DEPARTMENT Provider Note   CSN: [id number] Arrival date & time: 09/06/21  2146     History  CC: Fall, head injury   Sara Perkins is a 86 y.o. female with a history of dementia presenting from a memory facility with a fall backwards, striking the back of her head on the ground.  No loss of consciousness.  The patient is on Plavix.  Patient is demented but has no acute complaints on arrival.  HPI     Home Medications Prior to Admission medications   Medication Sig Start Date End Date Taking? Authorizing Provider  alendronate (FOSAMAX) 70 MG tablet Take 70 mg by mouth once a week. Take on Fridays 04/17/21   [provider]  atorvastatin (LIPITOR) 10 MG tablet TAKE 1 TABLET BY MOUTH  DAILY Patient taking differently: Take 10 mg by mouth daily. 05/25/19   Luetta Nutting, DO  calcium-vitamin D (OSCAL WITH D) 500-200 MG-UNIT tablet Take 1 tablet by mouth every evening. With dinner    [provider]  Cholecalciferol (VITAMIN D) 125 MCG (5000 UT) CAPS Take 1 capsule by mouth daily. 05/24/21   Little Ishikawa, MD  clopidogrel (PLAVIX) 75 MG tablet TAKE 1 TABLET BY MOUTH  DAILY Patient taking differently: Take 75 mg by mouth daily. 02/04/19   Luetta Nutting, DO  divalproex (DEPAKOTE SPRINKLE) 125 MG capsule Take 125 mg by mouth daily as needed. 05/22/21   [provider]  donepezil (ARICEPT) 10 MG tablet Take 10 mg by mouth daily. 05/22/21   [provider]  furosemide (LASIX) 40 MG tablet Take 1 tablet (40 mg total) by mouth daily. 11/23/18   Luetta Nutting, DO  HYDROcodone-acetaminophen (NORCO) 5-325 MG tablet Take 1 tablet by mouth every 4 (four) hours as needed. 06/14/21   Veryl Speak, MD  melatonin 5 MG TABS Take 5 mg by mouth at bedtime.    [provider]  memantine (NAMENDA) 10 MG tablet Take 10 mg by mouth 2 (two) times daily. 05/22/21   [provider]  Multiple Vitamins-Minerals  (CENTRUM ADULTS) TABS Take 1 tablet by mouth daily.    [provider]  potassium chloride SA (K-DUR,KLOR-CON) 20 MEQ tablet Take 1 tablet (20 mEq total) by mouth daily. 11/23/18   Luetta Nutting, DO  vitamin B-12 (CYANOCOBALAMIN) 1000 MCG tablet Take 1,000 mcg by mouth in the morning and at bedtime.    [provider]      Allergies    Patient has no known allergies.    Review of Systems   Review of Systems  Physical Exam Updated Vital Signs BP 140/65    Pulse (!) 59    Temp 98.9 F (37.2 C) (Oral)    Resp 17    Ht 5\' 6"  (1.676 m)    Wt 60 kg    SpO2 98%    BMI 21.35 kg/m  Physical Exam Constitutional:      General: She is not in acute distress. HENT:     Head: Normocephalic.     Comments: Occipital hematoma, small region of maceration over the occiput. Eyes:     Conjunctiva/sclera: Conjunctivae normal.     Pupils: Pupils are equal, round, and reactive to light.  Cardiovascular:     Rate and Rhythm: Normal rate and regular rhythm.  Pulmonary:     Effort: Pulmonary effort is normal. No respiratory distress.  Abdominal:     General: There is no distension.     Tenderness: There  is no abdominal tenderness.  Skin:    General: Skin is warm and dry.  Neurological:     General: No focal deficit present.     Mental Status: She is alert. Mental status is at baseline.    ED Results / Procedures / Treatments   Labs (all labs ordered are listed, but only abnormal results are displayed) Labs Reviewed - No data to display  EKG None  Radiology CT HEAD WO CONTRAST (5MM)  Result Date: 09/06/2021 CLINICAL DATA:  Head trauma fall EXAM: CT HEAD WITHOUT CONTRAST CT CERVICAL SPINE WITHOUT CONTRAST TECHNIQUE: Multidetector CT imaging of the head and cervical spine was performed following the standard protocol without intravenous contrast. Multiplanar CT image reconstructions of the cervical spine were also generated. RADIATION DOSE REDUCTION: This exam was performed  according to the departmental dose-optimization program which includes automated exposure control, adjustment of the mA and/or kV according to patient size and/or use of iterative reconstruction technique. COMPARISON:  CT brain and cervical spine 05/22/2021 FINDINGS: CT HEAD FINDINGS Brain: No acute territorial infarction, hemorrhage, or intracranial mass. Moderate atrophy. Extensive white matter hypodensity consistent with chronic small vessel ischemic change. Stable ventricle size Vascular: No hyperdense vessels. Vertebral and carotid vascular calcification Skull: Normal. Negative for fracture or focal lesion. Sinuses/Orbits: No acute finding. Other: Moderate right parietal scalp hematoma CT CERVICAL SPINE FINDINGS Alignment: Straightening of the cervical spine. Trace anterolisthesis C3 on C4 unchanged. Facet alignment within normal limits Skull base and vertebrae: No acute fracture. No primary bone lesion or focal pathologic process. Soft tissues and spinal canal: No prevertebral fluid or swelling. No visible canal hematoma. Disc levels: Multilevel degenerative changes, with advanced disc space narrowing C4 through C7. Facet degenerative changes at multiple levels with foraminal narrowing Upper chest: Negative. Other: None IMPRESSION: 1. No CT evidence for acute intracranial abnormality. 2. Straightening of the cervical spine with degenerative changes. No fracture is seen Electronically Signed   By: Donavan Foil M.D.   On: 09/06/2021 22:31   CT Cervical Spine Wo Contrast  Result Date: 09/06/2021 CLINICAL DATA:  Head trauma fall EXAM: CT HEAD WITHOUT CONTRAST CT CERVICAL SPINE WITHOUT CONTRAST TECHNIQUE: Multidetector CT imaging of the head and cervical spine was performed following the standard protocol without intravenous contrast. Multiplanar CT image reconstructions of the cervical spine were also generated. RADIATION DOSE REDUCTION: This exam was performed according to the departmental dose-optimization  program which includes automated exposure control, adjustment of the mA and/or kV according to patient size and/or use of iterative reconstruction technique. COMPARISON:  CT brain and cervical spine 05/22/2021 FINDINGS: CT HEAD FINDINGS Brain: No acute territorial infarction, hemorrhage, or intracranial mass. Moderate atrophy. Extensive white matter hypodensity consistent with chronic small vessel ischemic change. Stable ventricle size Vascular: No hyperdense vessels. Vertebral and carotid vascular calcification Skull: Normal. Negative for fracture or focal lesion. Sinuses/Orbits: No acute finding. Other: Moderate right parietal scalp hematoma CT CERVICAL SPINE FINDINGS Alignment: Straightening of the cervical spine. Trace anterolisthesis C3 on C4 unchanged. Facet alignment within normal limits Skull base and vertebrae: No acute fracture. No primary bone lesion or focal pathologic process. Soft tissues and spinal canal: No prevertebral fluid or swelling. No visible canal hematoma. Disc levels: Multilevel degenerative changes, with advanced disc space narrowing C4 through C7. Facet degenerative changes at multiple levels with foraminal narrowing Upper chest: Negative. Other: None IMPRESSION: 1. No CT evidence for acute intracranial abnormality. 2. Straightening of the cervical spine with degenerative changes. No fracture is seen Electronically Signed  By: Donavan Foil M.D.   On: 09/06/2021 22:31   DG Pelvis Portable  Result Date: 09/06/2021 CLINICAL DATA:  Trauma.  Fall. EXAM: PORTABLE PELVIS 1-2 VIEWS COMPARISON:  None. FINDINGS: The bones are osteopenic. There is no acute fracture or dislocation identified. Joint spaces are maintained. Degenerative changes affect the lower lumbar spine. Soft tissues are within normal limits. IMPRESSION: 1. No acute fracture or dislocation. 2. Diffuse osteopenia. Electronically Signed   By: Ronney Asters M.D.   On: 09/06/2021 22:14   DG Chest Portable 1 View  Result Date:  09/06/2021 CLINICAL DATA:  Trauma. EXAM: PORTABLE CHEST 1 VIEW COMPARISON:  None. FINDINGS: Left-sided pacemaker is present. The heart is mildly enlarged. The lungs are clear. There is no pleural effusion or pneumothorax. No acute fractures are seen. IMPRESSION: No acute cardiopulmonary process. Electronically Signed   By: Ronney Asters M.D.   On: 09/06/2021 22:13    Procedures Procedures    Medications Ordered in ED Medications - No data to display  ED Course/ Medical Decision Making/ A&P Clinical Course as of 09/07/21 0932  Thu Sep 06, 2021  2341 Patient's son is now present at the bedside, updated regarding patient's work-up, and will be taking her back to her facility.  Okay for discharge [MT]    Clinical Course User Index [MT] Leila Schuff, Carola Rhine, MD                           Medical Decision Making Amount and/or Complexity of Data Reviewed Radiology: ordered.   Patient is here with a fall, head injury isolated.  Due to her dementia, I have ordered a CT scan of the head and C-spine.  Have also ordered screening x-ray of the chest and pelvis, although she does have full range of the extremities, does not appear to be any distress at this time.  Low suspicion for intra-abdominal or intrathoracic surgical emergency.  Doubt T-spine or L-spine fracture clinically.  Bleeding is under control on arrival.  CT scans personally reviewed and interpreted, including xrays - no acute traumatic injuries noted at this time  Wound cleaned on occiput - unfortunately it is too superficial and macerated for staples or sutures.  Son was present at bedside for exam and discharge.        Final Clinical Impression(s) / ED Diagnoses Final diagnoses:  Hematoma of scalp, initial encounter    Rx / DC Orders ED Discharge Orders     None         Estoria Geary, Carola Rhine, MD 09/07/21 [id number]

## 2021-09-06 NOTE — Discharge Instructions (Signed)
The dressings on Sara Perkins's head should be changed once daily for 3 days.  She can shower normally afterwards.  She has a thin, macerated wound on the back of her scalp that cannot be closed with stitches or staples, but should scab over on its own in the next 3 days.  Her Ct scans of the brain and cervical spine did not show traumatic injury or fracture.

## 2021-09-06 NOTE — ED Notes (Addendum)
C-spine held until [facility] J can be retrieved and applied.

## 2021-09-06 NOTE — Progress Notes (Signed)
Orthopedic Tech Progress Note Patient Details:  Sara Perkins 27-Nov-1935 [id number] Level 2 trauma [name], female   DOB: November 29, 1935, 86 y.o.   MRN: [id number]  Sara Perkins 09/06/2021, 10:17 PM

## 2021-09-06 NOTE — ED Notes (Addendum)
Per Trifan MD, hold on labs due to patient agitation and after examination of pt, [place] J to be left off at this time.

## 2021-09-06 NOTE — ED Notes (Signed)
Pt transported to CT with TRN Simona Huh

## 2021-09-06 NOTE — Progress Notes (Signed)
Chaplain met patient at bedside upon admission to ED Room #28.  Her son was with her and she was alert and talkative, stating"Im crossing my fingers that I will be all okay"  She spoke of her spsiritual support from her faith.  Ended visit with a departing blessing.      09/06/21 2300  Clinical Encounter Type  Visited With Patient and family together  Visit Type Initial;Psychological support;Spiritual support;Social support  Referral From Nurse  Consult/Referral To Chaplain  Spiritual Encounters  Spiritual Needs Prayer;Emotional  Stress Factors  Patient Stress Factors Loss of control  Family Stress Factors None identified

## 2021-09-06 NOTE — ED Notes (Signed)
Pacemaker interogated

## 2021-10-22 ENCOUNTER — Emergency Department (HOSPITAL_BASED_OUTPATIENT_CLINIC_OR_DEPARTMENT_OTHER): Payer: Medicare Other | Admitting: Radiology

## 2021-10-22 ENCOUNTER — Other Ambulatory Visit: Payer: Self-pay

## 2021-10-22 ENCOUNTER — Emergency Department (HOSPITAL_BASED_OUTPATIENT_CLINIC_OR_DEPARTMENT_OTHER): Payer: Medicare Other

## 2021-10-22 ENCOUNTER — Emergency Department (HOSPITAL_BASED_OUTPATIENT_CLINIC_OR_DEPARTMENT_OTHER)
Admission: EM | Admit: 2021-10-22 | Discharge: 2021-10-22 | Disposition: A | Discharge status: Home / Self Care | Payer: Medicare Other | Attending: Emergency Medicine | Admitting: Emergency Medicine

## 2021-10-22 DIAGNOSIS — W19XXXA Unspecified fall, initial encounter: Secondary | ICD-10-CM | POA: Diagnosis not present

## 2021-10-22 DIAGNOSIS — M25551 Pain in right hip: Secondary | ICD-10-CM | POA: Diagnosis present

## 2021-10-22 DIAGNOSIS — Z131 Encounter for screening for diabetes mellitus: Secondary | ICD-10-CM | POA: Diagnosis not present

## 2021-10-22 DIAGNOSIS — R2689 Other abnormalities of gait and mobility: Secondary | ICD-10-CM | POA: Insufficient documentation

## 2021-10-22 DIAGNOSIS — Y92129 Unspecified place in nursing home as the place of occurrence of the external cause: Secondary | ICD-10-CM | POA: Diagnosis not present

## 2021-10-22 DIAGNOSIS — F0280 Dementia in other diseases classified elsewhere without behavioral disturbance: Secondary | ICD-10-CM | POA: Insufficient documentation

## 2021-10-22 DIAGNOSIS — G309 Alzheimer's disease, unspecified: Secondary | ICD-10-CM | POA: Insufficient documentation

## 2021-10-22 DIAGNOSIS — R296 Repeated falls: Secondary | ICD-10-CM

## 2021-10-22 LAB — COMPREHENSIVE METABOLIC PANEL
ALT: 11 U/L (ref 0–44)
AST: 19 U/L (ref 15–41)
Albumin: 4 g/dL (ref 3.5–5.0)
Alkaline Phosphatase: 49 U/L (ref 38–126)
Anion gap: 9 (ref 5–15)
BUN: 25 mg/dL — ABNORMAL HIGH (ref 8–23)
CO2: 28 mmol/L (ref 22–32)
Calcium: 9.2 mg/dL (ref 8.9–10.3)
Chloride: 103 mmol/L (ref 98–111)
Creatinine, Ser: 0.89 mg/dL (ref 0.44–1.00)
GFR, Estimated: >60 mL/min (ref >60)
Glucose, Bld: 87 mg/dL (ref 70–99)
Potassium: 4.2 mmol/L (ref 3.5–5.1)
Sodium: 140 mmol/L (ref 135–145)
Total Bilirubin: 0.4 mg/dL (ref 0.3–1.2)
Total Protein: 6.6 g/dL (ref 6.5–8.1)

## 2021-10-22 LAB — CBC WITH DIFFERENTIAL/PLATELET
Abs Immature Granulocytes: 0.06 10*3/uL (ref 0.00–0.07)
Basophils Absolute: 0.1 10*3/uL (ref 0.0–0.1)
Basophils Relative: 1 %
Eosinophils Absolute: 0.2 10*3/uL (ref 0.0–0.5)
Eosinophils Relative: 3 %
HCT: 41.3 % (ref 36.0–46.0)
Hemoglobin: 13.4 g/dL (ref 12.0–15.0)
Immature Granulocytes: 1 %
Lymphocytes Relative: 35 %
Lymphs Abs: 2.7 10*3/uL (ref 0.7–4.0)
MCH: 30 pg (ref 26.0–34.0)
MCHC: 32.4 g/dL (ref 30.0–36.0)
MCV: 92.6 fL (ref 80.0–100.0)
Monocytes Absolute: 0.9 10*3/uL (ref 0.1–1.0)
Monocytes Relative: 11 %
Neutro Abs: 3.8 10*3/uL (ref 1.7–7.7)
Neutrophils Relative %: 49 %
Platelets: 204 10*3/uL (ref 150–400)
RBC: 4.46 MIL/uL (ref 3.87–5.11)
RDW: 14.4 % (ref 11.5–15.5)
WBC: 7.7 10*3/uL (ref 4.0–10.5)
nRBC: 0 % (ref 0.0–0.2)

## 2021-10-22 LAB — CBG MONITORING, ED: Glucose-Capillary: 94 mg/dL (ref 70–99)

## 2021-10-22 NOTE — ED Triage Notes (Signed)
Arrived POV from [facility] accompanied by son. Concern for shuffling gait, difficulty walking and recurrent falls from facility, some unwitnessed, unknown LOC or head injury. Hx of alzheimer's. Denies pain when asked.  ?

## 2021-10-22 NOTE — ED Notes (Signed)
Pt's son verbalizes understanding of discharge instructions. Opportunity for questioning and answers were provided. Pt discharged from ED to Son's care.   ? ?

## 2021-10-22 NOTE — ED Provider Notes (Signed)
Care transferred to me.  Discussed results with son at the bedside and there is no overt findings.  Urinalysis still has not been obtained.  However the son indicates to me that the gait abnormality and shuffling has been for several weeks.  Falling for months.  Given all this, I think a negative head CT is unlikely to have missed a significant pathology from this long of time.  Do not think emergent MRI is needed.  She has been ambulatory.  No fractures.  At this point think is reasonable to discharge and have her follow-up with PCP.  Offered neuro referral but family declines.  Son does not want to wait on a urine as she has not been altered recently or otherwise acting different. ?  ?Sherwood Gambler, MD ?10/22/21 2333 ? ?

## 2021-10-22 NOTE — ED Provider Notes (Cosign Needed)
[facility] EMERGENCY DEPT Provider Note   CSN: [id number] Arrival date & time: 10/22/21  1803     History  Chief Complaint  Patient presents with   Gait Problem   Fall    Sara Perkins is a 86 y.o. female with history of Alzheimer's who presents with her son Sherren Mocha at the bedside with concern for change in gait, recurrent falls at facility ([facility]), and progressively decreased stability while walking.  Patient did have a fall today that was witnessed at the facility.  She did not hit her head nor did she have loss of consciousness at that time.  According to caretakers at the facility she had a strange shuffling kind of gait on the dining room and lost her balance.  She is complaining of right hip pain.  Level 5 caveat due to patient's underlying Alzheimer's.  I personally reviewed her medical records.  She is history of NSTEMI, TIA, Alzheimer's.  She is anticoagulated on aspirin and Plavix.  HPI     Home Medications Prior to Admission medications   Medication Sig Start Date End Date Taking? Authorizing Provider  alendronate (FOSAMAX) 70 MG tablet Take 70 mg by mouth once a week. Take on Fridays 04/17/21   [provider]  atorvastatin (LIPITOR) 10 MG tablet TAKE 1 TABLET BY MOUTH  DAILY Patient taking differently: Take 10 mg by mouth daily. 05/25/19   Luetta Nutting, DO  calcium-vitamin D (OSCAL WITH D) 500-200 MG-UNIT tablet Take 1 tablet by mouth every evening. With dinner    [provider]  Cholecalciferol (VITAMIN D) 125 MCG (5000 UT) CAPS Take 1 capsule by mouth daily. 05/24/21   Little Ishikawa, MD  clopidogrel (PLAVIX) 75 MG tablet TAKE 1 TABLET BY MOUTH  DAILY Patient taking differently: Take 75 mg by mouth daily. 02/04/19   Luetta Nutting, DO  divalproex (DEPAKOTE SPRINKLE) 125 MG capsule Take 125 mg by mouth daily as needed. 05/22/21   [provider]  donepezil (ARICEPT) 10 MG tablet Take 10  mg by mouth daily. 05/22/21   [provider]  furosemide (LASIX) 40 MG tablet Take 1 tablet (40 mg total) by mouth daily. 11/23/18   Luetta Nutting, DO  HYDROcodone-acetaminophen (NORCO) 5-325 MG tablet Take 1 tablet by mouth every 4 (four) hours as needed. 06/14/21   Veryl Speak, MD  melatonin 5 MG TABS Take 5 mg by mouth at bedtime.    [provider]  memantine (NAMENDA) 10 MG tablet Take 10 mg by mouth 2 (two) times daily. 05/22/21   [provider]  Multiple Vitamins-Minerals (CENTRUM ADULTS) TABS Take 1 tablet by mouth daily.    [provider]  potassium chloride SA (K-DUR,KLOR-CON) 20 MEQ tablet Take 1 tablet (20 mEq total) by mouth daily. 11/23/18   Luetta Nutting, DO  vitamin B-12 (CYANOCOBALAMIN) 1000 MCG tablet Take 1,000 mcg by mouth in the morning and at bedtime.    [provider]      Allergies    Patient has no known allergies.    Review of Systems   Review of Systems  Unable to perform ROS: Dementia   Physical Exam Updated Vital Signs BP (!) 141/59 (BP Location: Left Arm)    Pulse 63    Temp 98 F (36.7 C)    Resp 18    Ht '5\' 6"'$  (1.676 m)    Wt 59 kg    SpO2 97%    BMI 20.98 kg/m  Physical Exam  Vitals and nursing note reviewed.  Constitutional:      Appearance: She is not ill-appearing or toxic-appearing.  HENT:     Head: Normocephalic and atraumatic.     Nose: Nose normal.     Mouth/Throat:     Mouth: Mucous membranes are moist.     Pharynx: No oropharyngeal exudate or posterior oropharyngeal erythema.  Eyes:     General:        Right eye: No discharge.        Left eye: No discharge.     Extraocular Movements: Extraocular movements intact.     Conjunctiva/sclera: Conjunctivae normal.     Pupils: Pupils are equal, round, and reactive to light.  Cardiovascular:     Rate and Rhythm: Normal rate and regular rhythm.     Pulses: Normal pulses.     Heart sounds: Normal heart sounds.  Pulmonary:     Effort: Pulmonary  effort is normal. No respiratory distress.     Breath sounds: Normal breath sounds. No wheezing or rales.  Abdominal:     General: Bowel sounds are normal. There is no distension.     Palpations: Abdomen is soft.     Tenderness: There is no abdominal tenderness. There is no right CVA tenderness, left CVA tenderness, guarding or rebound.  Musculoskeletal:        General: No deformity.     Cervical back: Normal and neck supple. No tenderness or bony tenderness.     Thoracic back: Normal. No bony tenderness.     Lumbar back: Normal. No bony tenderness.     Right lower leg: No edema.     Left lower leg: No edema.       Legs:  Lymphadenopathy:     Cervical: No cervical adenopathy.  Skin:    General: Skin is warm and dry.     Capillary Refill: Capillary refill takes less than 2 seconds.  Neurological:     General: No focal deficit present.     Mental Status: She is alert and oriented to person, place, and time. Mental status is at baseline.     GCS: GCS eye subscore is 4. GCS verbal subscore is 5. GCS motor subscore is 6.     Cranial Nerves: Cranial nerves 2-12 are intact.     Sensory: Sensation is intact.     Motor: Motor function is intact.     Coordination: Coordination is intact.     Gait: Gait is intact.  Psychiatric:        Mood and Affect: Mood normal.    ED Results / Procedures / Treatments   Labs (all labs ordered are listed, but only abnormal results are displayed) Labs Reviewed - No data to display  EKG None  Radiology No results found.  Procedures Procedures   Medications Ordered in ED Medications - No data to display  ED Course/ Medical Decision Making/ A&P                           Medical Decision Making 86 year old female presents with her son the bedside with concern for change in her gait  and recurrent falls.  Hypertensive on tamsulosin/normal.  Cardiopulmonary exam is normal, abdominal exam is benign.  Patient tender over the right hip without  evidence of trauma.  Neurovascular intact all 4 extremities.  No focal deficit on neurologic exam.  Patient cognitive baseline.   The differential diagnosis for AMS is extensive and includes,  but is not limited to:  Drug overdose - opioids, alcohol, sedatives, antipsychotics, drug withdrawal, others Metabolic: hypoxia, hypoglycemia, hyperglycemia, hypercalcemia, hypernatremia, hyponatremia, uremia, hepatic encephalopathy, hypothyroidism, hyperthyroidism, vitamin B12 or thiamine deficiency, carbon monoxide poisoning, Wilson's disease, Lactic acidosis, DKA/HHOS Infectious: meningitis, encephalitis, bacteremia/sepsis, urinary tract infection, pneumonia, neurosyphilis Structural: Space-occupying lesion, (brain tumor, subdural hematoma, hydrocephalus,) Vascular: stroke, subarachnoid hemorrhage, coronary ischemia, hypertensive encephalopathy, CNS vasculitis, thrombotic thrombocytopenic purpura, disseminated intravascular coagulation, hyperviscosity Psychiatric: Schizophrenia, depression; Other: Seizure, hypothermia, heat stroke, ICU psychosis, dementia -"sundowning."   Amount and/or Complexity of Data Reviewed Labs: ordered.    Details: Labs pending at time of shift change. Radiology: ordered.    Details: Imaging pending at time of shift change.   Care of this patient signed out to ED provider Dr. Regenia Skeeter at time of shift change.  All pertinent HPI, physical exam, and laboratory findings were discussed with him prior to my partner.  I appreciate his collaboration of care with patient.  This chart was dictated using voice recognition software, Dragon. Despite the best efforts of this provider to proofread and correct errors, errors may still occur which can change documentation meaning.   Final Clinical Impression(s) / ED Diagnoses Final diagnoses:  None    Rx / DC Orders ED Discharge Orders     None         Emeline Darling, PA-C 10/22/21 2215

## 2021-11-01 ENCOUNTER — Other Ambulatory Visit: Payer: Self-pay

## 2021-11-01 ENCOUNTER — Observation Stay (HOSPITAL_BASED_OUTPATIENT_CLINIC_OR_DEPARTMENT_OTHER)
Admission: EM | Admit: 2021-11-01 | Discharge: 2021-11-02 | Disposition: A | Discharge status: Home / Self Care | Payer: Medicare Other | Attending: Internal Medicine | Admitting: Internal Medicine

## 2021-11-01 ENCOUNTER — Emergency Department (HOSPITAL_BASED_OUTPATIENT_CLINIC_OR_DEPARTMENT_OTHER): Payer: Medicare Other | Admitting: Radiology

## 2021-11-01 ENCOUNTER — Emergency Department (HOSPITAL_BASED_OUTPATIENT_CLINIC_OR_DEPARTMENT_OTHER): Payer: Medicare Other

## 2021-11-01 ENCOUNTER — Encounter (HOSPITAL_BASED_OUTPATIENT_CLINIC_OR_DEPARTMENT_OTHER): Payer: Self-pay | Admitting: Obstetrics and Gynecology

## 2021-11-01 DIAGNOSIS — Z79899 Other long term (current) drug therapy: Secondary | ICD-10-CM | POA: Insufficient documentation

## 2021-11-01 DIAGNOSIS — F0280 Dementia in other diseases classified elsewhere without behavioral disturbance: Secondary | ICD-10-CM | POA: Insufficient documentation

## 2021-11-01 DIAGNOSIS — G301 Alzheimer's disease with late onset: Secondary | ICD-10-CM | POA: Diagnosis not present

## 2021-11-01 DIAGNOSIS — E7800 Pure hypercholesterolemia, unspecified: Secondary | ICD-10-CM | POA: Insufficient documentation

## 2021-11-01 DIAGNOSIS — R296 Repeated falls: Secondary | ICD-10-CM

## 2021-11-01 DIAGNOSIS — Y92009 Unspecified place in unspecified non-institutional (private) residence as the place of occurrence of the external cause: Secondary | ICD-10-CM | POA: Diagnosis not present

## 2021-11-01 DIAGNOSIS — I10 Essential (primary) hypertension: Secondary | ICD-10-CM | POA: Insufficient documentation

## 2021-11-01 DIAGNOSIS — Z7902 Long term (current) use of antithrombotics/antiplatelets: Secondary | ICD-10-CM | POA: Diagnosis not present

## 2021-11-01 DIAGNOSIS — R627 Adult failure to thrive: Secondary | ICD-10-CM | POA: Insufficient documentation

## 2021-11-01 DIAGNOSIS — W19XXXA Unspecified fall, initial encounter: Secondary | ICD-10-CM | POA: Diagnosis not present

## 2021-11-01 DIAGNOSIS — I214 Non-ST elevation (NSTEMI) myocardial infarction: Secondary | ICD-10-CM | POA: Insufficient documentation

## 2021-11-01 DIAGNOSIS — Z950 Presence of cardiac pacemaker: Secondary | ICD-10-CM | POA: Diagnosis not present

## 2021-11-01 DIAGNOSIS — S0101XA Laceration without foreign body of scalp, initial encounter: Secondary | ICD-10-CM | POA: Diagnosis not present

## 2021-11-01 DIAGNOSIS — S0990XA Unspecified injury of head, initial encounter: Secondary | ICD-10-CM | POA: Diagnosis present

## 2021-11-01 DIAGNOSIS — G8929 Other chronic pain: Secondary | ICD-10-CM | POA: Diagnosis present

## 2021-11-01 DIAGNOSIS — Z8673 Personal history of transient ischemic attack (TIA), and cerebral infarction without residual deficits: Secondary | ICD-10-CM | POA: Insufficient documentation

## 2021-11-01 LAB — CBC WITH DIFFERENTIAL/PLATELET
Abs Immature Granulocytes: 0.02 10*3/uL (ref 0.00–0.07)
Basophils Absolute: 0.1 10*3/uL (ref 0.0–0.1)
Basophils Relative: 1 %
Eosinophils Absolute: 0.1 10*3/uL (ref 0.0–0.5)
Eosinophils Relative: 2 %
HCT: 40.5 % (ref 36.0–46.0)
Hemoglobin: 13.3 g/dL (ref 12.0–15.0)
Immature Granulocytes: 0 %
Lymphocytes Relative: 28 %
Lymphs Abs: 1.6 10*3/uL (ref 0.7–4.0)
MCH: 30.3 pg (ref 26.0–34.0)
MCHC: 32.8 g/dL (ref 30.0–36.0)
MCV: 92.3 fL (ref 80.0–100.0)
Monocytes Absolute: 0.6 10*3/uL (ref 0.1–1.0)
Monocytes Relative: 10 %
Neutro Abs: 3.5 10*3/uL (ref 1.7–7.7)
Neutrophils Relative %: 59 %
Platelets: 212 10*3/uL (ref 150–400)
RBC: 4.39 MIL/uL (ref 3.87–5.11)
RDW: 13.9 % (ref 11.5–15.5)
WBC: 5.9 10*3/uL (ref 4.0–10.5)
nRBC: 0 % (ref 0.0–0.2)

## 2021-11-01 LAB — COMPREHENSIVE METABOLIC PANEL
ALT: 12 U/L (ref 0–44)
AST: 20 U/L (ref 15–41)
Albumin: 4 g/dL (ref 3.5–5.0)
Alkaline Phosphatase: 50 U/L (ref 38–126)
Anion gap: 10 (ref 5–15)
BUN: 19 mg/dL (ref 8–23)
CO2: 29 mmol/L (ref 22–32)
Calcium: 9.4 mg/dL (ref 8.9–10.3)
Chloride: 100 mmol/L (ref 98–111)
Creatinine, Ser: 1.06 mg/dL — ABNORMAL HIGH (ref 0.44–1.00)
GFR, Estimated: 52 mL/min — ABNORMAL LOW (ref >60)
Glucose, Bld: 170 mg/dL — ABNORMAL HIGH (ref 70–99)
Potassium: 4.1 mmol/L (ref 3.5–5.1)
Sodium: 139 mmol/L (ref 135–145)
Total Bilirubin: 0.4 mg/dL (ref 0.3–1.2)
Total Protein: 6.9 g/dL (ref 6.5–8.1)

## 2021-11-01 LAB — TROPONIN I (HIGH SENSITIVITY)
Troponin I (High Sensitivity): 1106 ng/L (ref <18)
Troponin I (High Sensitivity): 1079 ng/L (ref <18)

## 2021-11-01 NOTE — ED Triage Notes (Signed)
Patient reports to the ER for fall. She is currently at [facility] and has had multiple falls recently. Patient is slow to respond but son reports this is baseline for her as she has Alzheimer's  ?

## 2021-11-01 NOTE — ED Provider Notes (Signed)
?[facility] EMERGENCY DEPT ?Provider Note ? ? ?CSN: [id number] ?Arrival date & time: 11/01/21  1709 ? ?  ? ?History ? ?Chief Complaint  ?Patient presents with  ? Fall  ? Head Injury  ? ? ?Sara Perkins is a 86 y.o. female with a past medical history of Alzheimer's dementia, TIA, NSTEMI, implantable pacemaker.  Patient is on aspirin and Plavix. ? ?Patient is alert to person and place only.  Patient's son reports that she is at her mental baseline.  Level 5 caveat applies. ? ?Patient's son reports that he went to visit his mother at [place] and found that she had fallen.  Son estimates fall at approximately 420pm as his brother was talking to her on the phone prior to his arrival.  Lytle Michaels was unwitnessed.  Son reports that patient has not been complaining of any pains.  Notes that she has had frequent falls over the last 4 to 5 months.  States that she had a shuffled gait at baseline however this has gotten gradually worse over the last 4 to 5 months. ? ? ? ? ? ? ?Fall ? ?Head Injury ? ?  ? ?Home Medications ?Prior to Admission medications   ?Medication Sig Start Date End Date Taking? Authorizing Provider  ?alendronate (FOSAMAX) 70 MG tablet Take 70 mg by mouth once a week. Take on Fridays 04/17/21   [provider]  ?atorvastatin (LIPITOR) 10 MG tablet TAKE 1 TABLET BY MOUTH  DAILY ?Patient taking differently: Take 10 mg by mouth daily. 05/25/19   Luetta Nutting, DO  ?calcium-vitamin D (OSCAL WITH D) 500-200 MG-UNIT tablet Take 1 tablet by mouth every evening. With dinner    [provider]  ?Cholecalciferol (VITAMIN D) 125 MCG (5000 UT) CAPS Take 1 capsule by mouth daily. 05/24/21   Little Ishikawa, MD  ?clopidogrel (PLAVIX) 75 MG tablet TAKE 1 TABLET BY MOUTH  DAILY ?Patient taking differently: Take 75 mg by mouth daily. 02/04/19   Luetta Nutting, DO  ?divalproex (DEPAKOTE SPRINKLE) 125 MG capsule Take 125 mg by mouth daily as needed. 05/22/21   [provider]   ?donepezil (ARICEPT) 10 MG tablet Take 10 mg by mouth daily. 05/22/21   [provider]  ?furosemide (LASIX) 40 MG tablet Take 1 tablet (40 mg total) by mouth daily. 11/23/18   Luetta Nutting, DO  ?HYDROcodone-acetaminophen (NORCO) 5-325 MG tablet Take 1 tablet by mouth every 4 (four) hours as needed. 06/14/21   Veryl Speak, MD  ?melatonin 5 MG TABS Take 5 mg by mouth at bedtime.    [provider]  ?memantine (NAMENDA) 10 MG tablet Take 10 mg by mouth 2 (two) times daily. 05/22/21   [provider]  ?Multiple Vitamins-Minerals (CENTRUM ADULTS) TABS Take 1 tablet by mouth daily.    [provider]  ?potassium chloride SA (K-DUR,KLOR-CON) 20 MEQ tablet Take 1 tablet (20 mEq total) by mouth daily. 11/23/18   Luetta Nutting, DO  ?vitamin B-12 (CYANOCOBALAMIN) 1000 MCG tablet Take 1,000 mcg by mouth in the morning and at bedtime.    [provider]  ?   ? ?Allergies    ?Patient has no known allergies.   ? ?Review of Systems   ?Review of Systems  ?Unable to perform ROS: Dementia  ? ?Physical Exam ?Updated Vital Signs ?BP 114/62 (BP Location: Right Arm)   Pulse 65   Temp 98.2 ?F (36.8 ?C)   Resp 16   SpO2 96%  ?Physical Exam ?Vitals and nursing note  reviewed.  ?Constitutional:   ?   General: She is not in acute distress. ?   Appearance: She is not ill-appearing, toxic-appearing or diaphoretic.  ?HENT:  ?   Head: Normocephalic. Laceration present. No raccoon eyes, Battle's sign, abrasion, contusion, right periorbital erythema or left periorbital erythema.  ?   Jaw: No trismus or pain on movement.  ? ?   Comments: 52m Laceration to occipital region of scalp as indicated above ?Eyes:  ?   General: No scleral icterus.    ?   Right eye: No discharge.     ?   Left eye: No discharge.  ?Cardiovascular:  ?   Rate and Rhythm: Normal rate.  ?   Pulses:     ?     Radial pulses are 2+ on the right side and 2+ on the left side.  ?Pulmonary:  ?   Effort: Pulmonary effort is normal. No  tachypnea, bradypnea or respiratory distress.  ?   Breath sounds: Normal breath sounds. No stridor.  ?Abdominal:  ?   General: Abdomen is flat. There is no distension. There are no signs of injury.  ?   Palpations: Abdomen is soft. There is no mass or pulsatile mass.  ?   Tenderness: There is no abdominal tenderness. There is no guarding or rebound.  ?Musculoskeletal:  ?   Cervical back: Normal.  ?   Thoracic back: No swelling, edema, deformity, signs of trauma, lacerations, spasms, tenderness or bony tenderness.  ?   Lumbar back: No swelling, edema, deformity, signs of trauma, lacerations, spasms, tenderness or bony tenderness.  ?   Comments: No midline tenderness or deformity to cervical, thoracic, or lumbar spine.  No leg length discrepancy or rotation to bilateral lower extremities.  Pelvis stable.  No tenderness, bony tenderness, deformity, contusion, laceration, ecchymosis to bilateral upper or lower extremities.  Patient able to move all limbs equally without difficulty or complaints of pain.  ?Skin: ?   General: Skin is warm and dry.  ?Neurological:  ?   General: No focal deficit present.  ?   Mental Status: She is alert. Mental status is at baseline.  ?   GCS: GCS eye subscore is 4. GCS verbal subscore is 5. GCS motor subscore is 6.  ?   Cranial Nerves: No dysarthria.  ?   Comments: Alert to person and place only.  Patient has not mental baseline. ? ?Patient able to stand and ambulate without difficulty.  Able to move all limbs equally without difficulty.  ?Psychiatric:     ?   Behavior: Behavior is cooperative.  ? ? ?ED Results / Procedures / Treatments   ?Labs ?(all labs ordered are listed, but only abnormal results are displayed) ?Labs Reviewed  ?COMPREHENSIVE METABOLIC PANEL - Abnormal; Notable for the following components:  ?    Result Value  ? Glucose, Bld 170 (*)   ? Creatinine, Ser 1.06 (*)   ? GFR, Estimated 52 (*)   ? All other components within normal limits  ?TROPONIN I (HIGH SENSITIVITY) -  Abnormal; Notable for the following components:  ? Troponin I (High Sensitivity) 1,106 (*)   ? All other components within normal limits  ?TROPONIN I (HIGH SENSITIVITY) - Abnormal; Notable for the following components:  ? Troponin I (High Sensitivity) 1,079 (*)   ? All other components within normal limits  ?URINE CULTURE  ?CBC WITH DIFFERENTIAL/PLATELET  ?URINALYSIS, ROUTINE W REFLEX MICROSCOPIC  ? ? ?EKG ?EKG Interpretation ? ?Date/Time:  Thursday November 01 2021 17:41:32 EDT ?Ventricular Rate:  61 ?PR Interval:  163 ?QRS Duration: 150 ?QT Interval:  461 ?QTC Calculation: 465 ?R Axis:   -59 ?Text Interpretation: Sinus rhythm Left bundle branch block Confirmed by Octaviano Glow [phone]) on 11/01/2021 6:48:54 PM ? ?Radiology ?DG Ribs Bilateral W/Chest ? ?Result Date: 11/01/2021 ?CLINICAL DATA:  Fall.  Bilateral hip and rib pain. EXAM: BILATERAL RIBS AND CHEST - 4+ VIEW COMPARISON:  01/29/2022 FINDINGS: Heart size is normal. Dual lead pacemaker in place. Aortic atherosclerosis is present. The lungs are clear. No pneumothorax or hemothorax. Rib films do not show a fracture on either side. IMPRESSION: No active cardiopulmonary disease.  No visible rib fracture. Electronically Signed   By: Nelson Chimes M.D.   On: 11/01/2021 18:50  ? ?DG Pelvis 1-2 Views ? ?Result Date: 11/01/2021 ?CLINICAL DATA:  [place].  Hip and pelvic pain. EXAM: PELVIS - 1-2 VIEW COMPARISON:  None. FINDINGS: There is no evidence of pelvic fracture or diastasis. No pelvic bone lesions are seen. IMPRESSION: Negative. Electronically Signed   By: Nelson Chimes M.D.   On: 11/01/2021 18:50  ? ?CT Head Wo Contrast ? ?Result Date: 11/01/2021 ?CLINICAL DATA:  [place] with laceration to the back of the head. Anticoagulated. EXAM: CT HEAD WITHOUT CONTRAST TECHNIQUE: Contiguous axial images were obtained from the base of the skull through the vertex without intravenous contrast. RADIATION DOSE REDUCTION: This exam was performed according to the departmental  dose-optimization program which includes automated exposure control, adjustment of the mA and/or kV according to patient size and/or use of iterative reconstruction technique. COMPARISON:  10/22/2021 FINDINGS: Brain: Pamalee Leyden

## 2021-11-01 NOTE — ED Notes (Signed)
Called [facility] to transport patient to [facility] rm# [phone] ?

## 2021-11-01 NOTE — ED Notes (Signed)
Md notified of lab values ? ?

## 2021-11-02 DIAGNOSIS — Z515 Encounter for palliative care: Secondary | ICD-10-CM

## 2021-11-02 DIAGNOSIS — I10 Essential (primary) hypertension: Secondary | ICD-10-CM | POA: Diagnosis not present

## 2021-11-02 DIAGNOSIS — G8929 Other chronic pain: Secondary | ICD-10-CM | POA: Diagnosis not present

## 2021-11-02 DIAGNOSIS — W19XXXA Unspecified fall, initial encounter: Secondary | ICD-10-CM

## 2021-11-02 DIAGNOSIS — Z950 Presence of cardiac pacemaker: Secondary | ICD-10-CM

## 2021-11-02 DIAGNOSIS — E7800 Pure hypercholesterolemia, unspecified: Secondary | ICD-10-CM | POA: Diagnosis present

## 2021-11-02 DIAGNOSIS — Z8673 Personal history of transient ischemic attack (TIA), and cerebral infarction without residual deficits: Secondary | ICD-10-CM

## 2021-11-02 DIAGNOSIS — Y92009 Unspecified place in unspecified non-institutional (private) residence as the place of occurrence of the external cause: Secondary | ICD-10-CM

## 2021-11-02 DIAGNOSIS — I214 Non-ST elevation (NSTEMI) myocardial infarction: Secondary | ICD-10-CM | POA: Diagnosis not present

## 2021-11-02 DIAGNOSIS — F5101 Primary insomnia: Secondary | ICD-10-CM | POA: Insufficient documentation

## 2021-11-02 DIAGNOSIS — G301 Alzheimer's disease with late onset: Secondary | ICD-10-CM | POA: Diagnosis not present

## 2021-11-02 DIAGNOSIS — Z7189 Other specified counseling: Secondary | ICD-10-CM

## 2021-11-02 DIAGNOSIS — F0280 Dementia in other diseases classified elsewhere without behavioral disturbance: Secondary | ICD-10-CM

## 2021-11-02 DIAGNOSIS — S0101XA Laceration without foreign body of scalp, initial encounter: Secondary | ICD-10-CM | POA: Diagnosis not present

## 2021-11-02 LAB — COMPREHENSIVE METABOLIC PANEL
ALT: 12 U/L (ref 0–44)
AST: 23 U/L (ref 15–41)
Albumin: 3.1 g/dL — ABNORMAL LOW (ref 3.5–5.0)
Alkaline Phosphatase: 49 U/L (ref 38–126)
Anion gap: 9 (ref 5–15)
BUN: 17 mg/dL (ref 8–23)
CO2: 27 mmol/L (ref 22–32)
Calcium: 8.9 mg/dL (ref 8.9–10.3)
Chloride: 102 mmol/L (ref 98–111)
Creatinine, Ser: 1 mg/dL (ref 0.44–1.00)
GFR, Estimated: 56 mL/min — ABNORMAL LOW (ref >60)
Glucose, Bld: 140 mg/dL — ABNORMAL HIGH (ref 70–99)
Potassium: 3.5 mmol/L (ref 3.5–5.1)
Sodium: 138 mmol/L (ref 135–145)
Total Bilirubin: 0.7 mg/dL (ref 0.3–1.2)
Total Protein: 5.8 g/dL — ABNORMAL LOW (ref 6.5–8.1)

## 2021-11-02 LAB — CBC
HCT: 37.4 % (ref 36.0–46.0)
Hemoglobin: 12.5 g/dL (ref 12.0–15.0)
MCH: 31.2 pg (ref 26.0–34.0)
MCHC: 33.4 g/dL (ref 30.0–36.0)
MCV: 93.3 fL (ref 80.0–100.0)
Platelets: 189 10*3/uL (ref 150–400)
RBC: 4.01 MIL/uL (ref 3.87–5.11)
RDW: 14.1 % (ref 11.5–15.5)
WBC: 6.9 10*3/uL (ref 4.0–10.5)
nRBC: 0 % (ref 0.0–0.2)

## 2021-11-02 LAB — TROPONIN I (HIGH SENSITIVITY): Troponin I (High Sensitivity): 1360 ng/L (ref <18)

## 2021-11-02 MED ORDER — CLOPIDOGREL BISULFATE 75 MG PO TABS
75.0000 mg | ORAL_TABLET | Freq: Every day | ORAL | Status: DC
  Administered 2021-11-02: 75 mg via ORAL
  Filled 2021-11-02: qty 1

## 2021-11-02 MED ORDER — SODIUM CHLORIDE 0.9% FLUSH
3.0000 mL | Freq: Two times a day (BID) | INTRAVENOUS | Status: DC
  Administered 2021-11-02 (×2): 3 mL via INTRAVENOUS

## 2021-11-02 MED ORDER — ENSURE ENLIVE PO LIQD: 237.0000 mL | Freq: Two times a day (BID) | ORAL | Status: DC

## 2021-11-02 MED ORDER — MELATONIN 5 MG PO TABS
5.0000 mg | ORAL_TABLET | Freq: Every day | ORAL | Status: DC
  Administered 2021-11-02: 5 mg via ORAL
  Filled 2021-11-02: qty 1

## 2021-11-02 MED ORDER — MEMANTINE HCL 10 MG PO TABS
10.0000 mg | ORAL_TABLET | Freq: Two times a day (BID) | ORAL | Status: DC
Start: 2021-11-02 — End: 2021-11-02
  Administered 2021-11-02: 10 mg via ORAL
  Filled 2021-11-02 (×2): qty 1

## 2021-11-02 MED ORDER — FUROSEMIDE 40 MG PO TABS
40.0000 mg | ORAL_TABLET | Freq: Every day | ORAL | Status: DC
  Administered 2021-11-02: 40 mg via ORAL
  Filled 2021-11-02: qty 1

## 2021-11-02 MED ORDER — ENOXAPARIN SODIUM 30 MG/0.3ML IJ SOSY
30.0000 mg | PREFILLED_SYRINGE | INTRAMUSCULAR | Status: DC
  Administered 2021-11-02: 30 mg via SUBCUTANEOUS
  Filled 2021-11-02: qty 0.3

## 2021-11-02 MED ORDER — POLYETHYLENE GLYCOL 3350 17 G PO PACK: 17.0000 g | PACK | Freq: Every day | ORAL | Status: DC | PRN

## 2021-11-02 MED ORDER — ATORVASTATIN CALCIUM 10 MG PO TABS
10.0000 mg | ORAL_TABLET | Freq: Every day | ORAL | Status: DC
  Administered 2021-11-02: 10 mg via ORAL
  Filled 2021-11-02: qty 1

## 2021-11-02 MED ORDER — DONEPEZIL HCL 10 MG PO TABS
10.0000 mg | ORAL_TABLET | Freq: Every day | ORAL | Status: DC
  Administered 2021-11-02: 10 mg via ORAL
  Filled 2021-11-02: qty 1

## 2021-11-02 MED ORDER — ACETAMINOPHEN 650 MG RE SUPP
650.0000 mg | Freq: Four times a day (QID) | RECTAL | Status: DC | PRN
Start: 2021-11-02 — End: 2021-11-02

## 2021-11-02 MED ORDER — ACETAMINOPHEN 325 MG PO TABS: 650.0000 mg | ORAL_TABLET | Freq: Four times a day (QID) | ORAL | Status: DC | PRN

## 2021-11-02 NOTE — NC FL2 (Addendum)
?[place] MEDICAID FL2 LEVEL OF CARE SCREENING TOOL  ?  ? ?IDENTIFICATION  ?Patient Name: ?Sara Perkins Birthdate: 09-29-1935 Sex: female Admission Date (Current Location): ?11/01/2021  ?[place] and [place] Number: ? [place] ?  Facility and Address:  ?[facility] [place] [address] [place], [place] [address] ?     Provider Number: ?[id number]  ?Attending Physician Name and Address:  ?Florencia Reasons, MD ? Relative Name and Phone Number:  ?Sherren Mocha [phone] ?   ?Current Level of Care: ?Hospital Recommended Level of Care: ?Assisted Living Facility Prior Approval Number: ?  ? ?Date Approved/Denied: ?  PASRR Number: ?  ? ?Discharge Plan: ?Other (Comment) ([facility]) ?  ? ?Current Diagnoses: ?Patient Active Problem List  ? Diagnosis Date Noted  ? Alzheimer's disease with late onset ([place]) 11/02/2021  ? Chronic pain 11/02/2021  ? Essential hypertension 11/02/2021  ? Hypercholesterolemia 11/02/2021  ? Primary insomnia 11/02/2021  ? NSTEMI (non-ST elevated myocardial infarction) ([place]) 05/22/2021  ? Fall at home, initial encounter 05/22/2021  ? Pacemaker 04/27/2018  ? Memory change 01/28/2018  ? Adjustment disorder with depressed mood 01/28/2018  ? History of TIA (transient ischemic attack) 01/28/2018  ? ? ?Orientation RESPIRATION BLADDER Height & Weight   ?  ?Place, Self ? Normal  (WDL) Weight: 135 lb 4.8 oz (61.4 kg) ?Height:  '5\' 6"'$  (167.6 cm)  ?BEHAVIORAL SYMPTOMS/MOOD NEUROLOGICAL BOWEL NUTRITION STATUS  ?     (WDL) Diet (Regular diet)  ?AMBULATORY STATUS COMMUNICATION OF NEEDS Skin   ?Limited Assist Verbally Other (Comment) (Appropriate for etnicity, dry,abrasion,head,leg,Left) ?  ?  ?  ?    ?     ?     ? ? ?Personal Care Assistance Level of Assistance  ?Bathing, Feeding, Dressing Bathing Assistance: Limited assistance ?Feeding assistance: Independent ?Dressing Assistance: Limited assistance ?   ? ?Functional Limitations Info  ?Sight, Hearing, Speech Sight Info: Impaired ?Hearing Info: Adequate  (WDL) ?Speech Info: Adequate (WDL)  ? ? ?SPECIAL CARE FACTORS FREQUENCY  ?PT (By licensed PT), OT (By licensed OT)   ?  ?PT Frequency: 3x min weekly ?OT Frequency: 3x min weekly ?  ?  ?  ?   ? ? ?Contractures Contractures Info: Not present  ? ? ?Additional Factors Info  ?Code Status, Allergies Code Status Info: Partial ?Allergies Info: No Known Allergies ?  ?  ?  ?   ? ?Current Medications (11/02/2021):  This is the current hospital active medication list ?Current Facility-Administered Medications  ?Medication Dose Route Frequency Provider Last Rate Last Admin  ? acetaminophen (TYLENOL) tablet 650 mg  650 mg Oral Q6H PRN Marcelyn Bruins, MD      ? Or  ? acetaminophen (TYLENOL) suppository 650 mg  650 mg Rectal Q6H PRN Marcelyn Bruins, MD      ? atorvastatin (LIPITOR) tablet 10 mg  10 mg Oral Daily Marcelyn Bruins, MD   10 mg at 11/02/21 0160  ? clopidogrel (PLAVIX) tablet 75 mg  75 mg Oral Daily Marcelyn Bruins, MD   75 mg at 11/02/21 1093  ? donepezil (ARICEPT) tablet 10 mg  10 mg Oral Daily Marcelyn Bruins, MD   10 mg at 11/02/21 0914  ? enoxaparin (LOVENOX) injection 30 mg  30 mg Subcutaneous Q24H Marcelyn Bruins, MD   30 mg at 11/02/21 0915  ? feeding supplement (ENSURE ENLIVE / ENSURE PLUS) liquid 237 mL  237 mL Oral BID BM Florencia Reasons, MD      ? furosemide (  LASIX) tablet 40 mg  40 mg Oral Daily Marcelyn Bruins, MD   40 mg at 11/02/21 1610  ? melatonin tablet 5 mg  5 mg Oral QHS Marcelyn Bruins, MD   5 mg at 11/02/21 0131  ? memantine (NAMENDA) tablet 10 mg  10 mg Oral BID Marcelyn Bruins, MD   10 mg at 11/02/21 9604  ? polyethylene glycol (MIRALAX / GLYCOLAX) packet 17 g  17 g Oral Daily PRN Marcelyn Bruins, MD      ? sodium chloride flush (NS) 0.9 % injection 3 mL  3 mL Intravenous Q12H Marcelyn Bruins, MD   3 mL at 11/02/21 1010  ? ? ? ?TAKE these medications   ?  ?alendronate 70 MG tablet ?Commonly known as: FOSAMAX ?Take 70 mg by mouth every Friday. ?   ?atorvastatin  10 MG tablet ?Commonly known as: LIPITOR ?TAKE 1 TABLET BY MOUTH  DAILY ?   ?calcium-vitamin D 500-200 MG-UNIT tablet ?Commonly known as: OSCAL WITH D ?Take 1 tablet by mouth every evening. With dinner ?   ?Centrum Adults Tabs ?Take 1 tablet by mouth daily. ?   ?clopidogrel 75 MG tablet ?Commonly known as: PLAVIX ?TAKE 1 TABLET BY MOUTH  DAILY ?   ?divalproex 125 MG capsule ?Commonly known as: DEPAKOTE SPRINKLE ?Take 125 mg by mouth 3 (three) times daily. ?   ?donepezil 10 MG tablet ?Commonly known as: ARICEPT ?Take 10 mg by mouth daily. ?   ?escitalopram 10 MG tablet ?Commonly known as: LEXAPRO ?Take 10 mg by mouth daily. ?   ?furosemide 40 MG tablet ?Commonly known as: LASIX ?Take 1 tablet (40 mg total) by mouth daily. ?   ?HYDROcodone-acetaminophen 5-325 MG tablet ?Commonly known as: Norco ?Take 1 tablet by mouth every 4 (four) hours as needed. ?What changed: reasons to take this ?   ?melatonin 5 MG Tabs ?Take 5 mg by mouth at bedtime. ?   ?memantine 10 MG tablet ?Commonly known as: NAMENDA ?Take 10 mg by mouth 2 (two) times daily. ?   ?potassium chloride SA 20 MEQ tablet ?Commonly known as: KLOR-CON M ?Take 1 tablet (20 mEq total) by mouth daily. ?   ?vitamin B-12 1000 MCG tablet ?Commonly known as: CYANOCOBALAMIN ?Take 1,000 mcg by mouth in the morning and at bedtime. ?   ?Vitamin D 125 MCG (5000 UT) Caps ?Take 1 capsule by mouth daily. ?What changed: how much to take ?   ?  ?   ? ? ?Relevant Imaging Results: ? ?Relevant Lab Results: ? ? ?Additional Information ?[phone] ? ?Milas Gain, LCSWA ? ? ? ? ?

## 2021-11-02 NOTE — Progress Notes (Signed)
PT Cancellation Note ? ?Patient Details ?Name: Sara Perkins ?MRN: [id number] ?DOB: 02-14-1936 ? ? ?Cancelled Treatment:    Reason Eval/Treat Not Completed: Patient not medically ready (pt with elevated troponin and runs of PVC on monitor, discussed with RN and will hold until MD rounds for clearance) ? ? ?Sara Perkins ?11/02/2021, 9:28 AM ?Marbella Markgraf P, PT ?Acute Rehabilitation Services ?Pager: [phone] ?Office: [phone] ? ?

## 2021-11-02 NOTE — Discharge Summary (Addendum)
? ?Discharge Summary ? ?[name] WGN:[id number] DOB: [birth year] ? ?PCP: Sara Manes, MD ? ?Admit date: 11/01/2021 ?Discharge date: 11/02/2021 ? ?Time spent: 13mns, more than 50% time spent on coordination of care.  ? ?Recommendations for Outpatient Follow-up:  ?F/u with PCP within a week  for hospital discharge follow up, repeat cbc/bmp at follow up ?[facility]?F/u with EP cardiology , message sent through epic ?Recommend palliative care continue to follow  ? ?Continue all home meds, no new meds ? ?Discharge Diagnoses:  ?Active Hospital Problems  ? Diagnosis Date Noted  ? NSTEMI (non-ST elevated myocardial infarction) [place] 05/22/20[place]  ? [place] disease [place] 11/02/2021  ? Chronic pain 11/02/2021  ? Essential hypertension 11/02/2021  ? Hypercholesterolemia 11/02/2021  ? Fall at home, initial encounter 05/22/2021  ? Pacemaker 04/27/2018  ? History of TIA (transient ischemic attack) 01/28/2018  ?  ?Resolved Hospital Problems  ?No resolved problems to display.  ? ? ?Discharge Condition: stable ? ?Diet recommendation: heart healthy ? ?Filed Weights  ? 11/02/21 0003  ?Weight: 61.4 kg  ? ? ?History of present illness: ( per admitting MD Dr Sara Perkins ?Chief Complaint: Fall ?  ?HPI: Sara Pickingis a 86y.o. female with medical history significant of Alzheimer's disease, insomnia, adjustment disorder, TIA, bradycardia status post pacemaker, chronic pain, hypertension, hyperlipidemia who presents after fall at her facility. ? ?History obtained with assistance of patient's family and chart review due to patient's memory issues.  Son went to visit her at her facility and found that she had fallen.  Based on when she was recently talked to the believe the fall occurred around 430.  Fall was unwitnessed.  Denies any pain afterwards.  Does have history of frequent falls of the past 4 to 5 months.  Believed to be due to worsening shuffling gait.  She did apparently hit her head and is on  Plavix. ? ?Patient denies any fevers chills, chest pain, shortness of breath, abdominal pain, constipation, diarrhea, nausea, vomiting. ?  ?ED Course: Vital signs in the ED significant for blood pressure in the 1784Oto 1962Xsystolic.  Lab work-up included CMP which was significant for glucose of 170.  CBC within normal limits.  Troponin elevated to 1106 on first check and 1079 on repeat.  Urinalysis and urine culture pending.  CTh no acute normality.  CT C-spine showed no acute normality but did demonstrate degenerative disc disease and arthritis.  Rib x-ray was negative for acute fracture.  Chest x-ray showed no acute normality.  Pelvis x-ray showed no acute abnormality.  Cardiology was consulted in the ED due to elevated troponin and stated they will see the patient in the morning.  Recommended trending troponin overnight and no intervention unless patient becomes symptomatic.  Plan to interrogate pacemaker as well. ? ?Hospital Course:  ?Principal Problem: ?  NSTEMI (non-ST elevated myocardial infarction) ([facility] ?Active Problems: ?  History of TIA (transient ischemic attack) ?  Pacemaker ?  Fall at home, initial encounter ?  Al[facility]'s disease with late onset ([facility] ?  Chronic pain ?  Essential hypertension ?  Hypercholesterolemia ? ? ?Assessment and Plan: ?  ?Fall ?> Patient presenting following a fall at her facility. ?> Has had recurrent falls for the past 4 to 5 months.  Appear to be mechanical. ?>  Imaging in the ED negative for any acute abnormality including CT head, CT C-spine, rib x-ray, chest x-ray, pelvis x-ray. ?> Reportedly due to worsening shuffling gait.  Has  refused neurology referral in the past. ?- family prefers patient go back to her facility with memory care, home health ?- patient is seen by palliative care her, recommend continue palliative care at facility  ?  ?Elevation of troponin ?Case discussed with cardiology Dr Sara Perkins who states no cardiology intervention recommended , patient can  follow with EP as outpatient ? ?  ?History of TIA ?Hyperlipidemia ?- Continue home atorvastatin, Plavix ?  ?Hypertension ?- Continue home Lasix ?F/u with pcp to monitor lasix use/oral intake in dementia, discussed with son who expressed understanding  ? ?Alzheimer's disease ?- Continue home Donepezil and Namenda ?  ?Chronic pain ?- Continue home Norco once/if it can be confirmed ?-she denies pain in the hospital ? ?FTT in the setting of dementia, seen by palliative care here, son desires patient go back to ALF with [facility] and memory care unit, I recommended palliative care to follow patient at the facility  ?Code status: no intubation  ? ?Consultations: ?Case discussed with cardiology ?Palliative care ?Case manager/social worker ?PT ? ?Discharge Exam: ?BP (!) 145/59 (BP Location: Right Wrist)   Pulse 65   Temp 98.5 ?F (36.9 ?C) (Oral)   Resp 12   Ht '5\' 6"'$  (1.676 m)   Wt 61.4 kg   SpO2 98%   BMI 21.84 kg/m?  ? ?General: demented, knows she is in the hospital, not able to name it, not oriented to time ?Cardiovascular: rrr ?Respiratory: normal respiratory effort ? ? ? ?Discharge Instructions   ? ? Diet general   Complete by: As directed ?  ? Increase activity slowly   Complete by: As directed ?  ? ?  ? ?Allergies as of 11/02/2021   ?No Known Allergies ?  ? ?  ?Medication List  ?  ? ?TAKE these medications   ? ?alendronate 70 MG tablet ?Commonly known as: FOSAMAX ?Take 70 mg by mouth every Friday. ?  ?atorvastatin 10 MG tablet ?Commonly known as: LIPITOR ?TAKE 1 TABLET BY MOUTH  DAILY ?  ?calcium-vitamin D 500-200 MG-UNIT tablet ?Commonly known as: OSCAL WITH D ?Take 1 tablet by mouth every evening. With dinner ?  ?Centrum Adults Tabs ?Take 1 tablet by mouth daily. ?  ?clopidogrel 75 MG tablet ?Commonly known as: PLAVIX ?TAKE 1 TABLET BY MOUTH  DAILY ?  ?divalproex 125 MG capsule ?Commonly known as: DEPAKOTE SPRINKLE ?Take 125 mg by mouth 3 (three) times daily. ?  ?donepezil 10 MG tablet ?Commonly known as:  ARICEPT ?Take 10 mg by mouth daily. ?  ?escitalopram 10 MG tablet ?Commonly known as: LEXAPRO ?Take 10 mg by mouth daily. ?  ?furosemide 40 MG tablet ?Commonly known as: LASIX ?Take 1 tablet (40 mg total) by mouth daily. ?  ?HYDROcodone-acetaminophen 5-325 MG tablet ?Commonly known as: Norco ?Take 1 tablet by mouth every 4 (four) hours as needed. ?What changed: reasons to take this ?  ?melatonin 5 MG Tabs ?Take 5 mg by mouth at bedtime. ?  ?memantine 10 MG tablet ?Commonly known as: NAMENDA ?Take 10 mg by mouth 2 (two) times daily. ?  ?potassium chloride SA 20 MEQ tablet ?Commonly known as: KLOR-CON M ?Take 1 tablet (20 mEq total) by mouth daily. ?  ?vitamin B-12 1000 MCG tablet ?Commonly known as: CYANOCOBALAMIN ?Take 1,000 mcg by mouth in the morning and at bedtime. ?  ?Vitamin D 125 MCG (5000 UT) Caps ?Take 1 capsule by mouth daily. ?What changed: how much to take ?  ? ?  ? ?No Known Allergies ? Follow-up  Information   ? ? Stoneking, Hal, MD Follow up in 1 week(s).   ?Specialty: Internal Medicine ?Why: hospital discharge follow up ?Contact information: ?[address] ?[address] ?[place] [address] ?[phone] ? ? ?  ?  ? ?  ?  ? ?  ? ? ? ?The results of significant diagnostics from this hospitalization (including imaging, microbiology, ancillary and laboratory) are listed below for reference.   ? ?Significant Diagnostic Studies: ?DG Ribs Bilateral W/Chest ? ?Result Date: 11/01/2021 ?CLINICAL DATA:  Fall.  Bilateral hip and rib pain. EXAM: BILATERAL RIBS AND CHEST - 4+ VIEW COMPARISON:  01/29/2022 FINDINGS: Heart size is normal. Dual lead pacemaker in place. Aortic atherosclerosis is present. The lungs are clear. No pneumothorax or hemothorax. Rib films do not show a fracture on either side. IMPRESSION: No active cardiopulmonary disease.  No visible rib fracture. Electronically Signed   By: Nelson Chimes M.D.   On: 11/01/2021 18:50  ? ?DG Lumbar Spine Complete ? ?Result Date: 10/22/2021 ?CLINICAL DATA:  Fall  EXAM: LUMBAR SPINE - COMPLETE 4+ VIEW COMPARISON:  09/21/2019.  CT 06/13/2021 FINDINGS: Moderate compression fracture through the superior endplate of L3, stable since prior CT. Degenerative disc and facet disease. No acut

## 2021-11-02 NOTE — H&P (Signed)
?History and Physical  ? ?Sara Perkins EGB:[id number] DOB: 1936/05/16 DOA: 11/01/2021 ? ?PCP: Lajean Manes, MD  ? ?Patient coming from: [place] ? ?Chief Complaint: Fall ? ?HPI: Sara Perkins is a 86 y.o. female with medical history significant of Alzheimer's disease, insomnia, adjustment disorder, TIA, bradycardia status post pacemaker, chronic pain, hypertension, hyperlipidemia who presents after fall at her facility. ? ?History obtained with assistance of patient's family and chart review due to patient's memory issues.  Son went to visit her at her facility and found that she had fallen.  Based on when she was recently talked to the believe the fall occurred around 430.  Fall was unwitnessed.  Denies any pain afterwards.  Does have history of frequent falls of the past 4 to 5 months.  Believed to be due to worsening shuffling gait.  She did apparently hit her head and is on Plavix. ? ?Patient denies any fevers chills, chest pain, shortness of breath, abdominal pain, constipation, diarrhea, nausea, vomiting. ? ?ED Course: Vital signs in the ED significant for blood pressure in the 371G to 626R systolic.  Lab work-up included CMP which was significant for glucose of 170.  CBC within normal limits.  Troponin elevated to 1106 on first check and 1079 on repeat.  Urinalysis and urine culture pending.  CTh no acute normality.  CT C-spine showed no acute normality but did demonstrate degenerative disc disease and arthritis.  Rib x-ray was negative for acute fracture.  Chest x-ray showed no acute normality.  Pelvis x-ray showed no acute abnormality.  Cardiology was consulted in the ED due to elevated troponin and stated they will see the patient in the morning.  Recommended trending troponin overnight and no intervention unless patient becomes symptomatic.  Plan to interrogate pacemaker as well. ? ?Review of Systems: As per HPI otherwise all other systems reviewed and are negative. ? ?Past Medical  History:  ?Diagnosis Date  ? Alzheimer disease ([place])   ? Broken foot 2017  ? Chicken pox   ? Syncope   ? Related to BP medication   ? TIA (transient ischemic attack)   ? UTI (urinary tract infection)   ? ? ?Past Surgical History:  ?Procedure Laterality Date  ? ABDOMINAL HYSTERECTOMY    ? APPENDECTOMY    ? PACEMAKER GENERATOR CHANGE  11/25/2016  ? [employer] L 101 generator change performed in [place] in [place]  ? PACEMAKER INSERTION  12/24/2008  ? MDT PPM implanted by Dr Joeseph Amor in [place]   ? ? ?Social History ? reports that she has never smoked. She has never been exposed to tobacco smoke. She has never used smokeless tobacco. She reports that she does not drink alcohol and does not use drugs. ? ?No Known Allergies ? ?Family History  ?Problem Relation Age of Onset  ? Kidney disease Father   ?Reviewed on admission ? ?Prior to Admission medications   ?Medication Sig Start Date End Date Taking? Authorizing Provider  ?alendronate (FOSAMAX) 70 MG tablet Take 70 mg by mouth once a week. Take on Fridays 04/17/21   [provider]  ?atorvastatin (LIPITOR) 10 MG tablet TAKE 1 TABLET BY MOUTH  DAILY ?Patient taking differently: Take 10 mg by mouth daily. 05/25/19   Luetta Nutting, DO  ?calcium-vitamin D (OSCAL WITH D) 500-200 MG-UNIT tablet Take 1 tablet by mouth every evening. With dinner    [provider]  ?Cholecalciferol (VITAMIN D) 125 MCG (5000 UT) CAPS Take 1 capsule by mouth daily. 05/24/21  Little Ishikawa, MD  ?clopidogrel (PLAVIX) 75 MG tablet TAKE 1 TABLET BY MOUTH  DAILY ?Patient taking differently: Take 75 mg by mouth daily. 02/04/19   Luetta Nutting, DO  ?divalproex (DEPAKOTE SPRINKLE) 125 MG capsule Take 125 mg by mouth daily as needed. 05/22/21   [provider]  ?donepezil (ARICEPT) 10 MG tablet Take 10 mg by mouth daily. 05/22/21   [provider]  ?furosemide (LASIX) 40 MG tablet Take 1 tablet (40 mg total) by mouth daily. 11/23/18   Luetta Nutting,  DO  ?HYDROcodone-acetaminophen (NORCO) 5-325 MG tablet Take 1 tablet by mouth every 4 (four) hours as needed. 06/14/21   Veryl Speak, MD  ?melatonin 5 MG TABS Take 5 mg by mouth at bedtime.    [provider]  ?memantine (NAMENDA) 10 MG tablet Take 10 mg by mouth 2 (two) times daily. 05/22/21   [provider]  ?Multiple Vitamins-Minerals (CENTRUM ADULTS) TABS Take 1 tablet by mouth daily.    [provider]  ?potassium chloride SA (K-DUR,KLOR-CON) 20 MEQ tablet Take 1 tablet (20 mEq total) by mouth daily. 11/23/18   Luetta Nutting, DO  ?vitamin B-12 (CYANOCOBALAMIN) 1000 MCG tablet Take 1,000 mcg by mouth in the morning and at bedtime.    [provider]  ? ? ?Physical Exam: ?Vitals:  ? 11/01/21 2130 11/01/21 2217 11/01/21 2218 11/02/21 0003  ?BP: (!) 153/67  122/66 (!) 148/53  ?Pulse: 65   72  ?Resp: '17 19  19  '$ ?Temp:    98.1 ?F (36.7 ?C)  ?TempSrc:    Oral  ?SpO2: 99%  99% 100%  ? ? ?Physical Exam ?Constitutional:   ?   General: She is not in acute distress. ?   Appearance: Normal appearance.  ?HENT:  ?   Head: Normocephalic.  ?   Comments: Posterior laceration ?   Mouth/Throat:  ?   Mouth: Mucous membranes are moist.  ?   Pharynx: Oropharynx is clear.  ?Eyes:  ?   Extraocular Movements: Extraocular movements intact.  ?   Pupils: Pupils are equal, round, and reactive to light.  ?Cardiovascular:  ?   Rate and Rhythm: Normal rate and regular rhythm.  ?   Pulses: Normal pulses.  ?   Heart sounds: Normal heart sounds.  ?Pulmonary:  ?   Effort: Pulmonary effort is normal. No respiratory distress.  ?   Breath sounds: Normal breath sounds.  ?Abdominal:  ?   General: Bowel sounds are normal. There is no distension.  ?   Palpations: Abdomen is soft.  ?   Tenderness: There is no abdominal tenderness.  ?Musculoskeletal:     ?   General: No swelling or deformity.  ?Skin: ?   General: Skin is warm and dry.  ?Neurological:  ?   General: No focal deficit present.  ?   Mental Status: Mental  status is at baseline.  ? ?Labs on Admission: I have personally reviewed following labs and imaging studies ? ?CBC: ?Recent Labs  ?Lab 11/01/21 ?1746  ?WBC 5.9  ?NEUTROABS 3.5  ?HGB 13.3  ?HCT 40.5  ?MCV 92.3  ?PLT 212  ? ? ?Basic Metabolic Panel: ?Recent Labs  ?Lab 11/01/21 ?1746  ?NA 139  ?K 4.1  ?CL 100  ?CO2 29  ?GLUCOSE 170*  ?BUN 19  ?CREATININE 1.06*  ?CALCIUM 9.4  ? ? ?GFR: ?Estimated Creatinine Clearance: 37.5 mL/min (A) (by C-G formula based on SCr of 1.06 mg/dL (H)). ? ?Liver Function Tests: ?Recent Labs  ?Lab 11/01/21 ?  1746  ?AST 20  ?ALT 12  ?ALKPHOS 50  ?BILITOT 0.4  ?PROT 6.9  ?ALBUMIN 4.0  ? ? ?Urine analysis: ?   ?Component Value Date/Time  ? COLORURINE YELLOW 06/14/2021 0000  ? APPEARANCEUR CLEAR 06/14/2021 0000  ? LABSPEC 1.014 06/14/2021 0000  ? PHURINE 5.0 06/14/2021 0000  ? GLUCOSEU NEGATIVE 06/14/2021 0000  ? HGBUR SMALL (A) 06/14/2021 0000  ? [place] NEGATIVE 06/14/2021 0000  ? KETONESUR 5 (A) 06/14/2021 0000  ? PROTEINUR NEGATIVE 06/14/2021 0000  ? NITRITE NEGATIVE 06/14/2021 0000  ? LEUKOCYTESUR TRACE (A) 06/14/2021 0000  ? ? ?Radiological Exams on Admission: ?DG Ribs Bilateral W/Chest ? ?Result Date: 11/01/2021 ?CLINICAL DATA:  Fall.  Bilateral hip and rib pain. EXAM: BILATERAL RIBS AND CHEST - 4+ VIEW COMPARISON:  01/29/2022 FINDINGS: Heart size is normal. Dual lead pacemaker in place. Aortic atherosclerosis is present. The lungs are clear. No pneumothorax or hemothorax. Rib films do not show a fracture on either side. IMPRESSION: No active cardiopulmonary disease.  No visible rib fracture. Electronically Signed   By: Nelson Chimes M.D.   On: 11/01/2021 18:50  ? ?DG Pelvis 1-2 Views ? ?Result Date: 11/01/2021 ?CLINICAL DATA:  [place].  Hip and pelvic pain. EXAM: PELVIS - 1-2 VIEW COMPARISON:  None. FINDINGS: There is no evidence of pelvic fracture or diastasis. No pelvic bone lesions are seen. IMPRESSION: Negative. Electronically Signed   By: Nelson Chimes M.D.   On: 11/01/2021 18:50  ? ?CT  Head Wo Contrast ? ?Result Date: 11/01/2021 ?CLINICAL DATA:  [place] with laceration to the back of the head. Anticoagulated. EXAM: CT HEAD WITHOUT CONTRAST TECHNIQUE: Contiguous axial images were obtained from th

## 2021-11-02 NOTE — Progress Notes (Signed)
Mobility Specialist Progress Note  ? ? 11/02/21 1232  ?Mobility  ?Activity Ambulated with assistance to bathroom  ?Level of Assistance Contact guard assist, steadying assist  ?Assistive Device  ?(HHA)  ?Distance Ambulated (ft) 20 ft  ?Activity Response Tolerated well  ?$Mobility charge 1 Mobility  ? ?Pt received with chair alarm going off trying to stand up. Had void in BR. Returned to chair with lap band alarm in place and on. Encourage pt to use call bell when wanting to get up.  ? ?Hildred Alamin ?Mobility Specialist  ?  ?

## 2021-11-02 NOTE — Progress Notes (Signed)
Called report to [facility] [phone].  ?

## 2021-11-02 NOTE — TOC Transition Note (Addendum)
Transition of Care (TOC) - CM/SW Discharge Note ? ? ?Patient Details  ?Name: Sara Perkins ?MRN: [id number] ?Date of Birth: 08-26-1935 ? ?Transition of Care (TOC) CM/SW Contact:  ?Milas Gain, LCSWA ?Phone Number: ?11/02/2021, 1:51 PM ? ? ?Clinical Narrative:    ? ?Patient will DC to: [facility] ? ?Anticipated DC date: 11/02/2021 ? ?Family notified: [name] ? ?Transport by: Patients son Sherren Mocha ? ?? ? ?Per MD patient ready for DC to [facility] . RN, patient, patient's family, and facility notified of DC. Discharge Page, and [place] orders sent to facility. Lysle Morales with [facility] confirmed no covid needed for patient to return.RN given number for report tele# [phone]. DC packet on chart. Patient to be transported to [facility] by son Sherren Mocha. ? ?CSW signing off.  ? ?Final next level of care: Assisted Living ([facility]) ?Barriers to Discharge: No Barriers Identified ? ? ?Patient Goals and CMS Choice ?  ?CMS Medicare.gov Compare Post Acute Care list provided to:: Patient Represenative (must comment) (Spoke with patients son Sherren Mocha) ?Choice offered to / list presented to : Adult Children (patients son Sherren Mocha) ? ?Discharge Placement ?  ?           ?Patient chooses bed at:  [facility]) ?Patient to be transferred to facility by: patients son Sherren Mocha ?Name of family member notified: [name] ?Patient and family notified of of transfer: 11/02/21 ? ?Discharge Plan and Services ?  ?  ?           ?  ?  ?  ?  ?  ?  ?  ?  ?  ?  ? ?Social Determinants of Health (SDOH) Interventions ?  ? ? ?Readmission Risk Interventions ?   ? View : No data to display.  ?  ?  ?  ? ? ? ? ? ?

## 2021-11-02 NOTE — Progress Notes (Signed)
Pt is not Alert & Oriented x 4, unable to complete admission documentation.  ? ?Elaina Hoops, RN ? ?

## 2021-11-02 NOTE — TOC Progression Note (Addendum)
Transition of Care (TOC) - Progression Note  ? ? ?Patient Details  ?Name: Sara Perkins ?MRN: [id number] ?Date of Birth: 12/06/1935 ? ?Transition of Care (TOC) CM/SW Contact  ?Milas Gain, LCSWA ?Phone Number: ?11/02/2021, 1:37 PM ? ?Clinical Narrative:    ? ? ?CSW spoke with [place] at [facility] who confirmed patient can return when medically ready for dc with [place] orders. HH orders,FL2, and DC summary. Lysle Morales with [facility] confirmed no covid needed for patient to return.CSW received consult for pallaitive services to follow patient at SNF. CSW spoke with patients son Sherren Mocha who declined for CSW to make referral for palliative services to follow patient at SNF. Sherren Mocha reports he wants to speak with his  brother before agreeing to palliative and will add palliative services on later for patient if he decides too.Sherren Mocha confirmed he will transport patient over to ALF when patients is medically ready for dc. CSW informed MD.CSW will continue to follow and assist with patients dc planning needs. ?  ?  ? ?Expected Discharge Plan and Services ?  ?  ?  ?  ?  ?Expected Discharge Date: 11/02/21               ?  ?  ?  ?  ?  ?  ?  ?  ?  ?  ? ? ?Social Determinants of Health (SDOH) Interventions ?  ? ?Readmission Risk Interventions ?   ? View : No data to display.  ?  ?  ?  ? ? ?

## 2021-11-02 NOTE — Care Management Obs Status (Signed)
MEDICARE OBSERVATION STATUS NOTIFICATION ? ? ?Patient Details  ?Name: Sara Perkins ?MRN: [id number] ?Date of Birth: 1936/07/29 ? ? ?Medicare Observation Status Notification Given:  Yes ? ? ? ?Bethena Roys, RN ?11/02/2021, 1:41 PM ?

## 2021-11-02 NOTE — Evaluation (Signed)
Physical Therapy Evaluation/ Discharge ?Patient Details ?Name: Sara Perkins ?MRN: [id number] ?DOB: [birth year] ?Today's Date: 11/02/2021 ? ?History of Present Illness ? 86 yo admitted 3/23 after fall at facility with imaging negative for fx and found to have NSTEMI. PMhx: Alzheimers, insomnia, TIA, bradycardia s/p PPM, chronic pain, HTN, HLD  ?Clinical Impression ? Pt with baseline dementia oriented only to self. Pt able to transition to sitting, standing and walk in hall with guarding for safety and direction. Pt from ALF with plans to return and will require supervision for safety and mobility to decrease fall risk. Pt reports she walks without AD at baseline and was able to demonstrate this during eval. No further therapy needed at this time as pt lacks insight into deficits and ability to retain new teaching for safety and DME use. Will sign off.    ?   ? ?Recommendations for follow up therapy are one component of a multi-disciplinary discharge planning process, led by the attending physician.  Recommendations may be updated based on patient status, additional functional criteria and insurance authorization. ? ?Follow Up Recommendations   ? ?  ?Assistance Recommended at Discharge Frequent or constant Supervision/Assistance  ?Patient can return home with the following ? A little help with walking and/or transfers;A little help with bathing/dressing/bathroom;Assistance with cooking/housework;Direct supervision/assist for financial management;Direct supervision/assist for medications management ? ?  ?Equipment Recommendations None recommended by PT  ?Recommendations for Other Services ?    ?  ?Functional Status Assessment Patient has not had a recent decline in their functional status  ? ?  ?Precautions / Restrictions Precautions ?Precautions: Fall ?Restrictions ?Weight Bearing Restrictions: No  ? ?  ? ?Mobility ? Bed Mobility ?Overal bed mobility: Needs Assistance ?Bed Mobility: Supine to Sit ?  ?  ?Supine to sit:  Min guard ?  ?  ?General bed mobility comments: guarding for safety and lines to rise from surface ?  ? ?Transfers ?Overall transfer level: Needs assistance ?  ?Transfers: Sit to/from Stand ?Sit to Stand: Min guard ?  ?  ?  ?  ?  ?General transfer comment: guarding with cues for safety ?  ? ?Ambulation/Gait ?Ambulation/Gait assistance: Min guard ?Gait Distance (Feet): 120 Feet ?Assistive device: None ?Gait Pattern/deviations: Step-through pattern, Decreased stride length ?  ?Gait velocity interpretation: 1.31 - 2.62 ft/sec, indicative of limited community ambulator ?  ?General Gait Details: pt with guarding for safety and balance with assist for direction without LOB ? ?Stairs ?  ?  ?  ?  ?  ? ?Wheelchair Mobility ?  ? ?Modified Rankin (Stroke Patients Only) ?  ? ?  ? ?Balance Overall balance assessment: Needs assistance ?Sitting-balance support: No upper extremity supported ?Sitting balance-Leahy Scale: Fair ?Sitting balance - Comments: no UE support ?  ?  ?Standing balance-Leahy Scale: Good ?Standing balance comment: pt standing without UE support, supervision for safety ?  ?  ?  ?  ?  ?  ?  ?  ?  ?  ?  ?   ? ? ? ?Pertinent Vitals/Pain Pain Assessment ?Pain Assessment: No/denies pain  ? ? ?Home Living Family/patient expects to be discharged to:: Assisted living ?  ?  ?  ?  ?  ?  ?  ?  ?Home Equipment: Kasandra Knudsen - single point ?   ?  ?Prior Function Prior Level of Function : Needs assist ?  ?  ?  ?  ?  ?  ?  ?ADLs Comments: staff for cleaning and cooking, pt states she performs ADLs  on her own but unsure of accuracy given cognitive deficits ?  ? ? ?Hand Dominance  ?   ? ?  ?Extremity/Trunk Assessment  ? Upper Extremity Assessment ?Upper Extremity Assessment: Overall WFL for tasks assessed ?  ? ?Lower Extremity Assessment ?Lower Extremity Assessment: Overall WFL for tasks assessed ?  ? ?Cervical / Trunk Assessment ?Cervical / Trunk Assessment: Normal  ?Communication  ? Communication: No difficulties  ?Cognition  Arousal/Alertness: Awake/alert ?Behavior During Therapy: Flat affect ?Overall Cognitive Status: History of cognitive impairments - at baseline ?  ?  ?  ?  ?  ?  ?  ?  ?  ?  ?  ?  ?  ?  ?  ?  ?General Comments: pt oriented to self only and able to follow single step commands inconsistently, lack of awareness and no insight into deficits ?  ?  ? ?  ?General Comments   ? ?  ?Exercises    ? ?Assessment/Plan  ?  ?PT Assessment Patient does not need any further PT services  ?PT Problem List Decreased cognition ? ?   ?  ?PT Treatment Interventions     ? ?PT Goals (Current goals can be found in the Care Plan section)  ?Acute Rehab PT Goals ?PT Goal Formulation: All assessment and education complete, DC therapy ? ?  ?Frequency   ?  ? ? ?Co-evaluation   ?  ?  ?  ?  ? ? ?  ?AM-PAC PT "6 Clicks" Mobility  ?Outcome Measure Help needed turning from your back to your side while in a flat bed without using bedrails?: None ?Help needed moving from lying on your back to sitting on the side of a flat bed without using bedrails?: A Little ?Help needed moving to and from a bed to a chair (including a wheelchair)?: A Little ?Help needed standing up from a chair using your arms (e.g., wheelchair or bedside chair)?: A Little ?Help needed to walk in hospital room?: A Little ?Help needed climbing 3-5 steps with a railing? : A Lot ?6 Click Score: 18 ? ?  ?End of Session Equipment Utilized During Treatment: Gait belt ?Activity Tolerance: Patient tolerated treatment well ?Patient left: in chair;with call bell/phone within reach;with nursing/sitter in room;with chair alarm set ?Nurse Communication: Mobility status ?PT Visit Diagnosis: Other abnormalities of gait and mobility (R26.89) ?  ? ?Time: 2836-6294 ?PT Time Calculation (min) (ACUTE ONLY): 12 min ? ? ?Charges:   PT Evaluation ?$PT Eval Moderate Complexity: 1 Mod ?  ?  ?   ? ? ?Mekhia Brogan P, PT ?Acute Rehabilitation Services ?Pager: [phone] ?Office: [phone] ? ? ?Cayden Rautio B  Japji Kok ?11/02/2021, 12:53 PM ? ?

## 2021-11-02 NOTE — Consult Note (Signed)
?Palliative Medicine Inpatient Consult Note ? ?Consulting Provider: Florencia Reasons, MD ? ?Reason for consult:   ?[facility] Consult  ?Reason for Consult? goals of care discussion  ? ?HPI:  ?Per intake H&P -->  Sara Perkins is a 86 y.o. female with medical history significant of Alzheimer's disease, insomnia, adjustment disorder, TIA, bradycardia status post pacemaker, chronic pain, hypertension, hyperlipidemia who presents after fall at her facility. ? ?History obtained with assistance of patient's family and chart review due to patient's memory issues.  Son went to visit her at her facility and found that she had fallen.  Based on when she was recently talked to the believe the fall occurred around 430.  Fall was unwitnessed.  Denies any pain afterwards.  Does have history of frequent falls of the past 4 to 5 months.  Believed to be due to worsening shuffling gait.  She did apparently hit her head and is on Plavix. ? ?Patient denies any fevers chills, chest pain, shortness of breath, abdominal pain, constipation, diarrhea, nausea, vomiting. ? ?Clinical Assessment/Goals of Care: ? ?*Please note that this is a verbal dictation therefore any spelling or grammatical errors are due to the "[facility] One" system interpretation. ? ?I have reviewed medical records including EPIC notes, labs and imaging, received report from bedside RN, assessed the patient who is lying in bed in NAD.  ?  ?I called patients son, Sara Perkins to further discuss diagnosis prognosis, GOC, EOL wishes, disposition and options. ?  ?I introduced Palliative Medicine as specialized medical care for people living with serious illness. It focuses on providing relief from the symptoms and stress of a serious illness. The goal is to improve quality of life for both the patient and the family. ? ?Medical History Review and Understanding: ? ?Sara Perkins and reviewed his mothers diagnosis of AD, he is aware of the chronic  progressive nature of this disease. ? ?H/O bradycardia with a pacemaker.  ? ?Social History: ? ?Sara Perkins is from [place] originally. She is a widow as her spouse passed away four years ago. 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 She was for the most part a homemaker. She had a faith prior to her AD though her son shares she has not mentioned church in recent years.  ? ?Functional and Nutritional State: ? ?Sara Perkins is living at [facility]. She is reliant on staff for bADL's though is "stubborn".  ? ?Nutritional intake has been variable, sone shares he is not sure why the staff does not bring her to the dining hall in the evenings.  ? ?Advance Directives: ? ?A detailed discussion was had today regarding advanced directives.  Yes, son will send a copy. ? ?Code Status: ?A discussion of code status was held. Sara Perkins shares that he and his brother would can chest compression, IV medications, shocks to be provided. They would not want her to be intubated her on artifical life support. ? ?I shared the worry that Sara Perkins could likely endure trauma if CPR were performed. I shared the importance of continued conversation as a family/ Shared that I will Email "Hard Choices for Loving People" booklet.  ? ?Discussion: ? ?Reviewed Sara Perkins's current situation. Patients son is on board with Palliative care support on the outpatient side. He shares the concern for continued decline and the need for he and his brother to talk more about her living situation. ? ?Reviewed differences between an ALF and skilled setting. I shared that Sara Perkins likely requires and  increased need in terms of support.  ? ?Discussed the importance of continued conversation with family and their  medical providers regarding overall plan of care and treatment options, ensuring decisions are within the context of the patients values and GOCs. ? ?Decision Maker: ?Sara Perkins (son) [phone] ? ?SUMMARY OF RECOMMENDATIONS   ?Partial - No Intubation all other  interventions for the time being --> Patients family will review "Hard Choices" book ? ?Will obtain Advance Directives and scan into [facility] ? ?TOC - OP Palliative Care ? ?Plan for discharge today ? ?Code Status/Advance Care Planning: ?No intubation ?  ?Palliative Prophylaxis:  ?Aspiration, Bowel Regimen, Delirium Protocol, Frequent Pain Assessment, Oral Care, Palliative Wound Care, and Turn Reposition ? ?Additional Recommendations (Limitations, Scope, Preferences): ?Continue to treat the treatable ? ?Psycho-social/Spiritual:  ?Desire for further Chaplaincy support: No ?Additional Recommendations: Education on chronic disease processes ?  ?Prognosis: Increased 12 month mortality risk in the setting of recurrent readmission, multiple co-morbidities, frailty.  ? ?Discharge Planning: Discharge to [facility]F with OP support. ? ?Vitals:  ? 11/02/21 0003 11/02/21 0510  ?BP: (!) 148/53 (!) 145/59  ?Pulse: 72 65  ?Resp: 19 12  ?Temp: 98.1 ?F (36.7 ?C) 98.5 ?F (36.9 ?C)  ?SpO2: 100% 98%  ? ?No intake or output data in the 24 hours ending 11/02/21 1112 ?Last Weight  Most recent update: 11/02/2021 12:47 AM  ? ? Weight  ?61.4 kg (135 lb 4.8 oz)  ?      ? ?  ? ?Gen:  Elderly Caucasian F in NAD ?HEENT: moist mucous membranes ?CV: Regular rate and rhythm  ?PULM:  On RA breathing even and non-labored ?ABD: soft/nontender  ?EXT: No edema  ?Neuro: Alert and oriented x1 ? ?PPS: 50% ? ? ?This conversation/these recommendations were discussed with patient primary care Perkins, Dr. Erlinda Perkins ? ?MDM High ?______________________________________________________ ?Sara Perkins ?Sara Perkins ?[facility] Cell Phone: [phone] ?Please utilize secure chat with additional questions, if there is no response within 30 minutes please call the above phone number ? ?Palliative Medicine [facility] providers are available by phone from 7am to 7pm daily and can be reached through the [facility] cell phone.  ?Should this patient require assistance  outside of these hours, please call the patient's attending physician. ? ? ?

## 2021-11-02 NOTE — Care Management CC44 (Signed)
Condition Code 44 Documentation Completed ? ?Patient Details  ?Name: Sara Perkins ?MRN: [id number] ?Date of Birth: Jan 30, 1936 ? ? ?Condition Code 44 given:  Yes ?Patient signature on Condition Code 44 notice:  Yes ?Documentation of 2 MD's agreement:  Yes ?Code 44 added to claim:  Yes ? ? ? ?Bethena Roys, RN ?11/02/2021, 1:42 PM ? ?

## 2021-11-02 NOTE — Progress Notes (Signed)
Initial Nutrition Assessment ? ?DOCUMENTATION CODES:  ?Not applicable ? ?INTERVENTION:  ?Liberalize diet to regular in the context of dementia and advanced age ?Ensure Enlive po BID, each supplement provides 350 kcal and 20 grams of protein. ?MVI with minerals daily ? ?NUTRITION DIAGNOSIS:  ?Increased nutrient needs related to acute illness (NSTEMI) as evidenced by estimated needs. ? ?GOAL:  ?Patient will meet greater than or equal to 90% of their needs ? ?MONITOR:  ?PO intake, Supplement acceptance, Labs ? ?REASON FOR ASSESSMENT:  ?Malnutrition Screening Tool ?  ? ?ASSESSMENT:  ?86 y.o. female with hx of dementia, HTN, HLD, and hx of TIA presented to ED after having a fall at her facility. Pt found to have elevated troponin likely related to trauma from fall. ? ?Pt resting in bed at the time of assessment. Awake and alert, but flat affect. Did not engage in much conversation. Most nutrition related questions were answered very vaguely.  ? ?Pt reports a normal appetite and no GI distress at this time. Ate sausage for breakfast this AM, when asked about additional foods she consumed she stated "more sausage". Unsure if she drinks ensure or likes them, will add for additional kcal as pt is thin on exam. ? ?Noted that pt has had several falls recently. Muscle and fat deficits present on exam, but unable to determine if this is due to poor nutrition or more in line with normal aging. Weight appears stable in hx.  ? ?Nutritionally Relevant Medications: ?Scheduled Meds: ? atorvastatin  10 mg Oral Daily  ? furosemide  40 mg Oral Daily  ? memantine  10 mg Oral BID  ? ?PRN Meds:  polyethylene glycol ? ?Labs Reviewed ? ?NUTRITION - FOCUSED PHYSICAL EXAM: ?Deficits present but could be related to normal aging as pt denies poor appetite and wt appears stable ?Flowsheet Row Most Recent Value  ?Orbital Region Mild depletion  ?Upper Arm Region No depletion  ?Thoracic and Lumbar Region No depletion  ?Buccal Region Mild depletion   ?Temple Region Mild depletion  ?Clavicle Bone Region Mild depletion  ?Clavicle and Acromion Bone Region Mild depletion  ?Scapular Bone Region Mild depletion  ?Dorsal Hand No depletion  ?Patellar Region Mild depletion  ?Anterior Thigh Region Mild depletion  ?Posterior Calf Region Mild depletion  ?Edema (RD Assessment) None  ?Hair Reviewed  ?Eyes Reviewed  ?Mouth Reviewed  ?Skin Reviewed  ?Nails Reviewed  ? ?Diet Order:   ?Diet Order   ? ?       ?  Diet regular Room service appropriate? No; Fluid consistency: Thin  Diet effective now       ?  ?  Diet general       ?  ? ?  ?  ? ?  ? ? ?EDUCATION NEEDS:  ?No education needs have been identified at this time ? ?Skin:  Skin Assessment: Reviewed RN Assessment ? ?Last BM:  prior to admission ? ?Height:  ?Ht Readings from Last 1 Encounters:  ?11/02/21 '5\' 6"'$  (1.676 m)  ? ? ?Weight:  ?Wt Readings from Last 1 Encounters:  ?11/02/21 61.4 kg  ? ? ?Ideal Body Weight:  59.1 kg ? ?BMI:  Body mass index is 21.84 kg/m?. ? ?Estimated Nutritional Needs:  ?Kcal:  1500-1700 kcal/d ?Protein:  75-85 g/d ?Fluid:  1.5-1.8 L/d ? ? ?Ranell Patrick, RD, LDN ?Clinical Dietitian ?RD pager # available in [place]  ?After hours/weekend pager # available in [place] ?

## 2021-11-20 ENCOUNTER — Ambulatory Visit (INDEPENDENT_AMBULATORY_CARE_PROVIDER_SITE_OTHER)

## 2021-11-20 DIAGNOSIS — I495 Sick sinus syndrome: Secondary | ICD-10-CM

## 2021-11-20 LAB — CUP PACEART REMOTE DEVICE CHECK
Battery Remaining Longevity: 54 mo
Battery Remaining Percentage: 87 %
Brady Statistic RA Percent Paced: 37 %
Brady Statistic RV Percent Paced: 0 %
Date Time Interrogation Session: 20230411023100
Implantable Lead Implant Date: 20100415
Implantable Lead Implant Date: 20100415
Implantable Lead Location: 753859
Implantable Lead Location: 753860
Implantable Lead Model: 5076
Implantable Lead Model: 5076
Implantable Pulse Generator Implant Date: 20180416
Lead Channel Impedance Value: 496 Ohm
Lead Channel Impedance Value: 507 Ohm
Lead Channel Pacing Threshold Amplitude: 0.4 V
Lead Channel Pacing Threshold Amplitude: 1 V
Lead Channel Pacing Threshold Pulse Width: 0.4 ms
Lead Channel Pacing Threshold Pulse Width: 0.4 ms
Lead Channel Setting Pacing Amplitude: 2 V
Lead Channel Setting Pacing Amplitude: 2.5 V
Lead Channel Setting Pacing Pulse Width: 0.4 ms
Lead Channel Setting Sensing Sensitivity: 2.5 mV
Pulse Gen Serial Number: 765776

## 2021-11-25 ENCOUNTER — Emergency Department (HOSPITAL_COMMUNITY)

## 2021-11-25 ENCOUNTER — Inpatient Hospital Stay (HOSPITAL_COMMUNITY)
Admission: EM | Admit: 2021-11-25 | Discharge: 2021-11-29 | DRG: 535 | Disposition: A | Discharge status: Skilled Nursing Facility | Source: Skilled Nursing Facility | Attending: Internal Medicine | Admitting: Internal Medicine

## 2021-11-25 ENCOUNTER — Encounter (HOSPITAL_COMMUNITY): Payer: Self-pay | Admitting: Internal Medicine

## 2021-11-25 ENCOUNTER — Other Ambulatory Visit: Payer: Self-pay

## 2021-11-25 DIAGNOSIS — Z841 Family history of disorders of kidney and ureter: Secondary | ICD-10-CM | POA: Diagnosis not present

## 2021-11-25 DIAGNOSIS — Z8673 Personal history of transient ischemic attack (TIA), and cerebral infarction without residual deficits: Secondary | ICD-10-CM

## 2021-11-25 DIAGNOSIS — G301 Alzheimer's disease with late onset: Secondary | ICD-10-CM | POA: Diagnosis present

## 2021-11-25 DIAGNOSIS — I252 Old myocardial infarction: Secondary | ICD-10-CM

## 2021-11-25 DIAGNOSIS — W1830XA Fall on same level, unspecified, initial encounter: Secondary | ICD-10-CM | POA: Diagnosis present

## 2021-11-25 DIAGNOSIS — R296 Repeated falls: Secondary | ICD-10-CM | POA: Diagnosis present

## 2021-11-25 DIAGNOSIS — Z7984 Long term (current) use of oral hypoglycemic drugs: Secondary | ICD-10-CM

## 2021-11-25 DIAGNOSIS — Z950 Presence of cardiac pacemaker: Secondary | ICD-10-CM | POA: Diagnosis not present

## 2021-11-25 DIAGNOSIS — Y92129 Unspecified place in nursing home as the place of occurrence of the external cause: Secondary | ICD-10-CM | POA: Diagnosis not present

## 2021-11-25 DIAGNOSIS — Z7983 Long term (current) use of bisphosphonates: Secondary | ICD-10-CM

## 2021-11-25 DIAGNOSIS — Z90710 Acquired absence of both cervix and uterus: Secondary | ICD-10-CM | POA: Diagnosis not present

## 2021-11-25 DIAGNOSIS — Z79899 Other long term (current) drug therapy: Secondary | ICD-10-CM | POA: Diagnosis not present

## 2021-11-25 DIAGNOSIS — S32401A Unspecified fracture of right acetabulum, initial encounter for closed fracture: Secondary | ICD-10-CM | POA: Diagnosis present

## 2021-11-25 DIAGNOSIS — S32591A Other specified fracture of right pubis, initial encounter for closed fracture: Principal | ICD-10-CM | POA: Diagnosis present

## 2021-11-25 DIAGNOSIS — M810 Age-related osteoporosis without current pathological fracture: Secondary | ICD-10-CM | POA: Diagnosis present

## 2021-11-25 DIAGNOSIS — Z9181 History of falling: Secondary | ICD-10-CM | POA: Diagnosis not present

## 2021-11-25 DIAGNOSIS — I10 Essential (primary) hypertension: Secondary | ICD-10-CM | POA: Diagnosis present

## 2021-11-25 DIAGNOSIS — E876 Hypokalemia: Secondary | ICD-10-CM | POA: Diagnosis present

## 2021-11-25 DIAGNOSIS — S3282XA Multiple fractures of pelvis without disruption of pelvic ring, initial encounter for closed fracture: Secondary | ICD-10-CM

## 2021-11-25 DIAGNOSIS — W19XXXA Unspecified fall, initial encounter: Principal | ICD-10-CM

## 2021-11-25 DIAGNOSIS — Z79890 Hormone replacement therapy (postmenopausal): Secondary | ICD-10-CM | POA: Diagnosis not present

## 2021-11-25 DIAGNOSIS — F4321 Adjustment disorder with depressed mood: Secondary | ICD-10-CM | POA: Diagnosis present

## 2021-11-25 DIAGNOSIS — F0280 Dementia in other diseases classified elsewhere without behavioral disturbance: Secondary | ICD-10-CM | POA: Diagnosis present

## 2021-11-25 DIAGNOSIS — Z515 Encounter for palliative care: Secondary | ICD-10-CM | POA: Diagnosis not present

## 2021-11-25 DIAGNOSIS — Z66 Do not resuscitate: Secondary | ICD-10-CM | POA: Diagnosis present

## 2021-11-25 DIAGNOSIS — E7800 Pure hypercholesterolemia, unspecified: Secondary | ICD-10-CM | POA: Diagnosis present

## 2021-11-25 LAB — CBC WITH DIFFERENTIAL/PLATELET
Abs Immature Granulocytes: 0.07 10*3/uL (ref 0.00–0.07)
Basophils Absolute: 0.1 10*3/uL (ref 0.0–0.1)
Basophils Relative: 1 %
Eosinophils Absolute: 0.1 10*3/uL (ref 0.0–0.5)
Eosinophils Relative: 1 %
HCT: 35.8 % — ABNORMAL LOW (ref 36.0–46.0)
Hemoglobin: 12.2 g/dL (ref 12.0–15.0)
Immature Granulocytes: 1 %
Lymphocytes Relative: 9 %
Lymphs Abs: 0.9 10*3/uL (ref 0.7–4.0)
MCH: 31.8 pg (ref 26.0–34.0)
MCHC: 34.1 g/dL (ref 30.0–36.0)
MCV: 93.2 fL (ref 80.0–100.0)
Monocytes Absolute: 0.9 10*3/uL (ref 0.1–1.0)
Monocytes Relative: 9 %
Neutro Abs: 8.4 10*3/uL — ABNORMAL HIGH (ref 1.7–7.7)
Neutrophils Relative %: 79 %
Platelets: 157 10*3/uL (ref 150–400)
RBC: 3.84 MIL/uL — ABNORMAL LOW (ref 3.87–5.11)
RDW: 14.8 % (ref 11.5–15.5)
WBC: 10.5 10*3/uL (ref 4.0–10.5)
nRBC: 0 % (ref 0.0–0.2)

## 2021-11-25 LAB — COMPREHENSIVE METABOLIC PANEL
ALT: 17 U/L (ref 0–44)
AST: 31 U/L (ref 15–41)
Albumin: 3.1 g/dL — ABNORMAL LOW (ref 3.5–5.0)
Alkaline Phosphatase: 54 U/L (ref 38–126)
Anion gap: 9 (ref 5–15)
BUN: 13 mg/dL (ref 8–23)
CO2: 22 mmol/L (ref 22–32)
Calcium: 8.5 mg/dL — ABNORMAL LOW (ref 8.9–10.3)
Chloride: 104 mmol/L (ref 98–111)
Creatinine, Ser: 0.84 mg/dL (ref 0.44–1.00)
GFR, Estimated: >60 mL/min (ref >60)
Glucose, Bld: 139 mg/dL — ABNORMAL HIGH (ref 70–99)
Potassium: 4 mmol/L (ref 3.5–5.1)
Sodium: 135 mmol/L (ref 135–145)
Total Bilirubin: 1.3 mg/dL — ABNORMAL HIGH (ref 0.3–1.2)
Total Protein: 5.7 g/dL — ABNORMAL LOW (ref 6.5–8.1)

## 2021-11-25 MED ORDER — CLOPIDOGREL BISULFATE 75 MG PO TABS
75.0000 mg | ORAL_TABLET | Freq: Every day | ORAL | Status: DC
  Administered 2021-11-26 – 2021-11-29 (×4): 75 mg via ORAL
  Filled 2021-11-25 (×4): qty 1

## 2021-11-25 MED ORDER — ATORVASTATIN CALCIUM 10 MG PO TABS
10.0000 mg | ORAL_TABLET | Freq: Every day | ORAL | Status: DC
  Administered 2021-11-26 – 2021-11-29 (×4): 10 mg via ORAL
  Filled 2021-11-25 (×4): qty 1

## 2021-11-25 MED ORDER — HEPARIN SODIUM (PORCINE) 5000 UNIT/ML IJ SOLN
5000.0000 [IU] | Freq: Three times a day (TID) | INTRAMUSCULAR | Status: DC
  Administered 2021-11-26 – 2021-11-29 (×9): 5000 [IU] via SUBCUTANEOUS
  Filled 2021-11-25 (×10): qty 1

## 2021-11-25 MED ORDER — ACETAMINOPHEN 500 MG PO TABS: 1000.0000 mg | ORAL_TABLET | Freq: Four times a day (QID) | ORAL | Status: DC | PRN

## 2021-11-25 MED ORDER — MELATONIN 5 MG PO TABS
5.0000 mg | ORAL_TABLET | Freq: Every day | ORAL | Status: DC
  Administered 2021-11-25 – 2021-11-28 (×3): 5 mg via ORAL
  Filled 2021-11-25 (×4): qty 1

## 2021-11-25 MED ORDER — ONDANSETRON HCL 4 MG PO TABS: 4.0000 mg | ORAL_TABLET | Freq: Four times a day (QID) | ORAL | Status: DC | PRN

## 2021-11-25 MED ORDER — ONDANSETRON HCL 4 MG/2ML IJ SOLN: 4.0000 mg | Freq: Four times a day (QID) | INTRAMUSCULAR | Status: DC | PRN

## 2021-11-25 MED ORDER — ESCITALOPRAM OXALATE 10 MG PO TABS
10.0000 mg | ORAL_TABLET | Freq: Every day | ORAL | Status: DC
  Administered 2021-11-26 – 2021-11-29 (×4): 10 mg via ORAL
  Filled 2021-11-25 (×4): qty 1

## 2021-11-25 MED ORDER — ACETAMINOPHEN 650 MG RE SUPP: 650.0000 mg | Freq: Four times a day (QID) | RECTAL | Status: DC | PRN

## 2021-11-25 MED ORDER — DONEPEZIL HCL 10 MG PO TABS
10.0000 mg | ORAL_TABLET | Freq: Every day | ORAL | Status: DC
  Administered 2021-11-26 – 2021-11-29 (×4): 10 mg via ORAL
  Filled 2021-11-25 (×5): qty 1

## 2021-11-25 MED ORDER — OXYCODONE HCL 5 MG PO TABS
5.0000 mg | ORAL_TABLET | Freq: Four times a day (QID) | ORAL | Status: DC | PRN
  Administered 2021-11-25 – 2021-11-29 (×4): 5 mg via ORAL
  Filled 2021-11-25 (×4): qty 1

## 2021-11-25 MED ORDER — DIVALPROEX SODIUM 125 MG PO CSDR
125.0000 mg | DELAYED_RELEASE_CAPSULE | Freq: Three times a day (TID) | ORAL | Status: DC
  Administered 2021-11-26 – 2021-11-29 (×8): 125 mg via ORAL
  Filled 2021-11-25 (×15): qty 1

## 2021-11-25 MED ORDER — SENNOSIDES-DOCUSATE SODIUM 8.6-50 MG PO TABS: 1.0000 | ORAL_TABLET | Freq: Every evening | ORAL | Status: DC | PRN

## 2021-11-25 MED ORDER — MORPHINE SULFATE (PF) 2 MG/ML IV SOLN: 1.0000 mg | INTRAVENOUS | Status: DC | PRN

## 2021-11-25 MED ORDER — MEMANTINE HCL 10 MG PO TABS
10.0000 mg | ORAL_TABLET | Freq: Two times a day (BID) | ORAL | Status: DC
  Administered 2021-11-25 – 2021-11-29 (×7): 10 mg via ORAL
  Filled 2021-11-25 (×10): qty 1

## 2021-11-25 NOTE — ED Notes (Signed)
Attempt bedside commode to obtain urine sample. Pt c/o bilateral hip pain. Md notified ?

## 2021-11-25 NOTE — ED Notes (Signed)
EMS brought pt in on 4L [place] o2. Trailed off O2. Pt slow desated to 87% on room air. Placed back on 2L [place] o2. O2 back up to 95%.  ?

## 2021-11-25 NOTE — Assessment & Plan Note (Signed)
Continue home Lexapro and Depakote. ?

## 2021-11-25 NOTE — Assessment & Plan Note (Signed)
Significant memory deficits.  High risk for delirium. ?-Continue home donepezil and Namenda ?-Delirium precautions ?-CODE STATUS is DNR per discussion with family on admission ?

## 2021-11-25 NOTE — Assessment & Plan Note (Signed)
Continue atorvastatin

## 2021-11-25 NOTE — ED Triage Notes (Signed)
PT found on floor at SNF when they went to go get her for lunch. Facility reported increaseed weakness over the past 12-24, states usually walks and do adls but has not be able to get out of wheelchair. Hx alz, was at baseline at facility. Ems reports she started hallucinating and becoming altered enroute. ? ?Hx MI, alz ?On plavix ?

## 2021-11-25 NOTE — ED Provider Notes (Signed)
?[facility] ?Provider Note ? ? ?CSN: [id number] ?Arrival date & time: 11/25/21  1712 ? ?  ? ?History ? ?Chief Complaint  ?Patient presents with  ? Fall  ?  UNWITNESSED  ? ? ?Sara Perkins is a 86 y.o. female. ? ?HPI ?Patient with history of Alzheimer's presents from nursing facility after possible fall.  Reportedly the patient was found on the ground by staff, brought here for evaluation.  Seemingly the patient is interacting at baseline, history is obtained by EMS and chart review.  Patient was admitted about 1 month ago, following possible NSTEMI, discharged with palliative services to a nursing facility. ? ?  ? ?Home Medications ?Prior to Admission medications   ?Medication Sig Start Date End Date Taking? Authorizing Provider  ?alendronate (FOSAMAX) 70 MG tablet Take 70 mg by mouth every Friday. 04/17/21   [provider]  ?atorvastatin (LIPITOR) 10 MG tablet TAKE 1 TABLET BY MOUTH  DAILY ?Patient taking differently: Take 10 mg by mouth daily. 05/25/19   Luetta Nutting, DO  ?calcium-vitamin D (OSCAL WITH D) 500-200 MG-UNIT tablet Take 1 tablet by mouth every evening. With dinner    [provider]  ?Cholecalciferol (VITAMIN D) 125 MCG (5000 UT) CAPS Take 1 capsule by mouth daily. ?Patient taking differently: Take 5,000 Units by mouth daily. 05/24/21   Little Ishikawa, MD  ?clopidogrel (PLAVIX) 75 MG tablet TAKE 1 TABLET BY MOUTH  DAILY ?Patient taking differently: Take 75 mg by mouth daily. 02/04/19   Luetta Nutting, DO  ?divalproex (DEPAKOTE SPRINKLE) 125 MG capsule Take 125 mg by mouth 3 (three) times daily. 05/22/21   [provider]  ?donepezil (ARICEPT) 10 MG tablet Take 10 mg by mouth daily. 05/22/21   [provider]  ?escitalopram (LEXAPRO) 10 MG tablet Take 10 mg by mouth daily.    [provider]  ?furosemide (LASIX) 40 MG tablet Take 1 tablet (40 mg total) by mouth daily. 11/23/18   Luetta Nutting, DO   ?HYDROcodone-acetaminophen (NORCO) 5-325 MG tablet Take 1 tablet by mouth every 4 (four) hours as needed. ?Patient taking differently: Take 1 tablet by mouth every 4 (four) hours as needed (pain). 06/14/21   Veryl Speak, MD  ?melatonin 5 MG TABS Take 5 mg by mouth at bedtime.    [provider]  ?memantine (NAMENDA) 10 MG tablet Take 10 mg by mouth 2 (two) times daily. 05/22/21   [provider]  ?Multiple Vitamins-Minerals (CENTRUM ADULTS) TABS Take 1 tablet by mouth daily.    [provider]  ?potassium chloride SA (K-DUR,KLOR-CON) 20 MEQ tablet Take 1 tablet (20 mEq total) by mouth daily. 11/23/18   Luetta Nutting, DO  ?vitamin B-12 (CYANOCOBALAMIN) 1000 MCG tablet Take 1,000 mcg by mouth in the morning and at bedtime.    [provider]  ?   ? ?Allergies    ?Patient has no known allergies.   ? ?Review of Systems   ?Review of Systems  ?Unable to perform ROS: Dementia  ? ?Physical Exam ?Updated Vital Signs ?BP (!) 157/65   Pulse 72   Temp 98.5 ?F (36.9 ?C) (Oral)   Resp 20   SpO2 95%  ?Physical Exam ?Vitals and nursing note reviewed.  ?Constitutional:   ?   General: She is not in acute distress. ?   Appearance: She is well-developed.  ?HENT:  ?   Head: Normocephalic and atraumatic.  ?Eyes:  ?   Conjunctiva/sclera: Conjunctivae normal.  ?Cardiovascular:  ?  Rate and Rhythm: Normal rate and regular rhythm.  ?Pulmonary:  ?   Effort: Pulmonary effort is normal. No respiratory distress.  ?   Breath sounds: Normal breath sounds. No stridor.  ?Abdominal:  ?   General: There is no distension.  ?Skin: ?   General: Skin is warm and dry.  ?Neurological:  ?   Mental Status: She is alert.  ?   Cranial Nerves: No cranial nerve deficit.  ?   Comments: Moves all extremities spontaneously.  However, the patient will not stand for weightbearing  ?Psychiatric:     ?   Mood and Affect: Mood normal.     ?   Cognition and Memory: Memory is impaired.  ? ? ?ED Results / Procedures / Treatments    ?Labs ?(all labs ordered are listed, but only abnormal results are displayed) ?Labs Reviewed  ?COMPREHENSIVE METABOLIC PANEL  ?CBC WITH DIFFERENTIAL/PLATELET  ?URINALYSIS, ROUTINE W REFLEX MICROSCOPIC  ? ? ?EKG ?None ? ?Radiology ?DG Pelvis 1-2 Views ? ?Result Date: 11/25/2021 ?CLINICAL DATA:  Status post fall. EXAM: PELVIS - 1-2 VIEW COMPARISON:  November 01, 2021 FINDINGS: Ill-defined fracture deformities are seen involving the right superior and right inferior pubic rami. This is a new finding when compared to the prior study. There is no evidence of dislocation. No pelvic bone lesions are seen. IMPRESSION: Nondisplaced fractures of the right superior and right inferior pubic rami. CT correlation is recommended. Electronically Signed   By: Virgina Norfolk M.D.   On: 11/25/2021 19:51  ? ?CT Head Wo Contrast ? ?Result Date: 11/25/2021 ?CLINICAL DATA:  Mental status change, unknown cause EXAM: CT HEAD WITHOUT CONTRAST TECHNIQUE: Contiguous axial images were obtained from the base of the skull through the vertex without intravenous contrast. RADIATION DOSE REDUCTION: This exam was performed according to the departmental dose-optimization program which includes automated exposure control, adjustment of the mA and/or kV according to patient size and/or use of iterative reconstruction technique. COMPARISON:  11/01/2021 FINDINGS: Brain: Confluent hypodensities are again seen throughout the periventricular and subcortical white matter, consistent with chronic small vessel ischemic change. No evidence of acute infarct or hemorrhage. Lateral ventricles and midline structures are stable. No acute extra-axial fluid collections. No mass effect. Vascular: Stable atherosclerosis.  No hyperdense vessel. Skull: Normal. Negative for fracture or focal lesion. Sinuses/Orbits: No acute finding. Other: None. IMPRESSION: 1. No acute intracranial process. Stable chronic small vessel ischemic changes. Electronically Signed   By: Randa Ngo M.D.   On: 11/25/2021 19:17   ? ?Procedures ?Procedures  ? ? ?Medications Ordered in ED ?Medications - No data to display ? ?ED Course/ Medical Decision Making/ A&P ?This patient with a Hx of Alzheimer's, frequent falls presents to the ED for concern of pain following a fall, this involves an extensive number of treatment options, and is a complaint that carries with it a high risk of complications and morbidity.   ? ?The differential diagnosis includes intracranial injury, fracture, weakness contributing to fall ? ? ?Social Determinants of Health: ? ?Age, dementia ? ?Additional history obtained: ? ?Additional history and/or information obtained from initially from chart review, the patient was subsequently joined by family members, notable for chart review notable for NSTEMI, 1 month ago, family members that the patient has been recovering generally well until today, was seen walking as recently as a few days ago. ? ? ?After the initial evaluation, orders, including: CT, x-ray, labs were initiated. ? ? ?Patient placed on Cardiac and Pulse-Oximetry Monitors. ?The patient was  maintained on a cardiac monitor.  The cardiac monitored showed an rhythm of 70 sinus normal ?The patient was also maintained on pulse oximetry. The readings were typically 100% room air normal ? ? ?On repeat evaluation of the patient stayed the same ? ?Lab Tests: ? ?I personally interpreted labs.  The pertinent results include: No notable findings ? ?Imaging Studies ordered: ? ?I independently visualized and interpreted imaging which showed CT head unremarkable, CT pelvis pending, but x-ray of the pelvis with multiple pelvic fractures ?I agree with the radiologist interpretation ? ?Consultations Obtained: ? ?I requested consultation with the internal medicine team,  and discussed lab and imaging findings as well as pertinent plan - they recommend: Admission ? ?Dispostion / Final MDM: ? ?After consideration of the diagnostic results and the  patient's response to treatment, adult female with dementia presents after a unwitnessed fall.  Patient has pain in her hips, is unwilling to bear weight, but is moving all extremities spontaneously when supine.  Dementia i

## 2021-11-25 NOTE — Assessment & Plan Note (Addendum)
No longer on antihypertensives.  BP stable, continue to monitor. ?

## 2021-11-25 NOTE — Assessment & Plan Note (Addendum)
Frequent falls ?Patient with likely fragility fracture after fall from standing resulting in superior and inferior right-sided pubic rami fracture with involvement of the anterior medial right acetabulum.  Discussed with orthopedics, Dr. Stann Mainland, management is nonoperative at this point and consists of pain control and weightbearing as tolerated. ?-Orthopedics to assess in a.m. ?-Continue judicious analgesics as needed ?-Weightbearing as tolerated, PT/OT ?-Fall precautions ?-Currently resides at ALF, may need SNF on discharge ?

## 2021-11-25 NOTE — Hospital Course (Addendum)
Sara Perkins is a 85 y.o. female with past medical history of dementia, symptomatic bradycardia status post pacemaker placement, history of CVA, hypertension, hyperlipidemia, adjustment disorder with depressed mood, osteoporosis, frequent falls and failure to thrive was admitted to the hospital for right pelvic fracture after fall. Patient presented from assisted living facility.  Patient was then admitted hospital for further evaluation and treatment. ? ?Assessment and Plan: ? ?* Closed fracture of multiple pubic rami, right, initial encounter (Sara Perkins) ?Frequent falls ?Orthopedics was consulted.  Plan is  nonoperative treatment at this time.  Orthopedic recommended weightbearing as tolerated.    Patient is currently at assisted living facility and PT OT recommended skilled nursing facility placement at this time. ? ?Alzheimer's disease with late onset (Sara Perkins) ?Significant memory deficits.  High risk for delirium.  Continue donepezil and Namenda. ?  ?Hypercholesterolemia ?Continue atorvastatin. ?  ?Essential hypertension ?Not on antihypertensives. ?  ?History of TIA (transient ischemic attack) ?Continue atorvastatin and Plavix. ?  ?Adjustment disorder with depressed mood ?Continue home Lexapro and Depakote. ? ?Hypokalemia.  Mild.  Improved after replacement.  Potassium prior to discharge was 4.6. ? ?Hospice care patient. ? ?

## 2021-11-25 NOTE — Assessment & Plan Note (Signed)
Continue home atorvastatin and Plavix. ?

## 2021-11-25 NOTE — H&P (Addendum)
?History and Physical  ? ? ?Sara Perkins BHA:[id number] DOB: [birth year] DOA: 11/25/2021 ? ?PCP: Lajean Manes, MD  ?Patient coming from: ALF ? ?I have personally briefly reviewed patient's old medical records in [place] ? ?Chief Complaint: Hip pain after a fall ? ?HPI: ?Sara Perkins is a 86 y.o. female with medical history significant for Alzheimer's dementia, symptomatic bradycardia s/p PPM, history of CVA, HTN, HLD, adjustment disorder with depressed mood, osteoporosis, frequent falls, and failure to thrive who presented to the ED from her ALF for evaluation of hip pain after a fall.  History is limited from patient due to dementia and is otherwise supplemented by EDP, chart review, and patient's son and daughter-in-law at bedside. ? ?Patient currently resides at an ALF/memory care facility.  She has a history of frequent falls.  She has a rolling walker but per family does not use it often.  She was found by her facility attendant down on the ground in her apartment earlier today (4/16).  It is unknown how long she was down on the ground and under what circumstances she fell.  When they tried to stand her up she was unable to do so because of significant pain in her right hip. ? ?EMS were called to her facility and she was brought to the ED for further evaluation.  Patient currently denies any chest pain, dyspnea, abdominal pain. ? ?ED Course  Labs/Imaging on admission: I have personally reviewed following labs and imaging studies. ? ?Initial vitals showed BP 156/66, pulse 76, RR 15, temp 98.5 ?F, SPO2 100% on 4 L O2 via [place]. ? ?Labs show WBC 10.5, hemoglobin 12.2, platelets 157,000, sodium 135, potassium 4.0, bicarb 22, BUN 13, creatinine 0.84, serum glucose 139. ? ?CT head without contrast negative for acute intracranial process.  Stable chronic small vessel ischemic changes noted. ? ?Pelvic x-ray shows nondisplaced fractures of the right superior and right inferior pubic rami. ? ?CT pelvis without  contrast shows acute fractures of the right superior and right inferior pubic rami with extension to involve the anterior medial aspect of the right acetabulum.  No evidence of dislocation. ? ?The hospitalist service was consulted to admit for further evaluation and management. ? ?Review of Systems: All systems reviewed and are negative except as documented in history of present illness above. ? ? ?Past Medical History:  ?Diagnosis Date  ? Alzheimer disease ([place])   ? Broken foot 2017  ? Chicken pox   ? Syncope   ? Related to BP medication   ? TIA (transient ischemic attack)   ? UTI (urinary tract infection)   ? ? ?Past Surgical History:  ?Procedure Laterality Date  ? ABDOMINAL HYSTERECTOMY    ? APPENDECTOMY    ? PACEMAKER GENERATOR CHANGE  11/25/2016  ? [employer] L 101 generator change performed in [place] in [place]  ? PACEMAKER INSERTION  12/24/2008  ? MDT PPM implanted by Dr Joeseph Amor in [place]   ? ? ?Social History: ? reports that she has never smoked. She has never been exposed to tobacco smoke. She has never used smokeless tobacco. She reports that she does not drink alcohol and does not use drugs. ? ?No Known Allergies ? ?Family History  ?Problem Relation Age of Onset  ? Kidney disease Father   ? ? ? ?Prior to Admission medications   ?Medication Sig Start Date End Date Taking? Authorizing Provider  ?alendronate (FOSAMAX) 70 MG tablet Take 70 mg by mouth every Friday. 04/17/21   [provider]  ?atorvastatin (LIPITOR) 10 MG tablet TAKE 1 TABLET BY MOUTH  DAILY ?Patient taking differently: Take 10 mg by mouth daily. 05/25/19   Luetta Nutting, DO  ?calcium-vitamin D (OSCAL WITH D) 500-200 MG-UNIT tablet Take 1 tablet by mouth every evening. With dinner    [provider]  ?Cholecalciferol (VITAMIN D) 125 MCG (5000 UT) CAPS Take 1 capsule by mouth daily. ?Patient taking differently: Take 5,000 Units by mouth daily. 05/24/21   Little Ishikawa, MD  ?clopidogrel (PLAVIX) 75 MG  tablet TAKE 1 TABLET BY MOUTH  DAILY ?Patient taking differently: Take 75 mg by mouth daily. 02/04/19   Luetta Nutting, DO  ?divalproex (DEPAKOTE SPRINKLE) 125 MG capsule Take 125 mg by mouth 3 (three) times daily. 05/22/21   [provider]  ?donepezil (ARICEPT) 10 MG tablet Take 10 mg by mouth daily. 05/22/21   [provider]  ?escitalopram (LEXAPRO) 10 MG tablet Take 10 mg by mouth daily.    [provider]  ?furosemide (LASIX) 40 MG tablet Take 1 tablet (40 mg total) by mouth daily. 11/23/18   Luetta Nutting, DO  ?HYDROcodone-acetaminophen (NORCO) 5-325 MG tablet Take 1 tablet by mouth every 4 (four) hours as needed. ?Patient taking differently: Take 1 tablet by mouth every 4 (four) hours as needed (pain). 06/14/21   Veryl Speak, MD  ?melatonin 5 MG TABS Take 5 mg by mouth at bedtime.    [provider]  ?memantine (NAMENDA) 10 MG tablet Take 10 mg by mouth 2 (two) times daily. 05/22/21   [provider]  ?Multiple Vitamins-Minerals (CENTRUM ADULTS) TABS Take 1 tablet by mouth daily.    [provider]  ?potassium chloride SA (K-DUR,KLOR-CON) 20 MEQ tablet Take 1 tablet (20 mEq total) by mouth daily. 11/23/18   Luetta Nutting, DO  ?vitamin B-12 (CYANOCOBALAMIN) 1000 MCG tablet Take 1,000 mcg by mouth in the morning and at bedtime.    [provider]  ? ? ?Physical Exam: ?Vitals:  ? 11/25/21 1830 11/25/21 1945 11/25/21 2015 11/25/21 2315  ?BP: (!) 149/63 (!) 157/65 137/63 139/69  ?Pulse:  72 68   ?Resp:  20    ?Temp:      ?TempSrc:      ?SpO2:  95% 99%   ? ?Constitutional: Elderly woman resting supine in bed, NAD, calm, comfortable while at rest ?Eyes: PERRL, EOMI, lids and conjunctivae normal ?ENMT: Mucous membranes are moist. Posterior pharynx clear of any exudate or lesions.Normal dentition.  ?Neck: normal, supple, no masses. ?Respiratory: clear to auscultation anteriorly. Normal respiratory effort. No accessory muscle use.  ?Cardiovascular: Regular  rate and rhythm, no murmurs / rubs / gallops. No extremity edema. 2+ pedal pulses. ?Abdomen: no tenderness, no masses palpated. ?Musculoskeletal: no clubbing / cyanosis.  Moves all extremities spontaneously although limited at right hip due to pelvic fractures.  Was unable to bear weight on standing with EDP. ?Skin: no rashes, lesions, ulcers. ?Neurologic: Sensation intact. Strength 5/5 in all 4.  ?Psychiatric: Awake, alert.  Oriented to self and recognizes family at bedside.  Not oriented to place, year, situation. ? ?EKG: Not performed. ? ?Assessment/Plan ?Principal Problem: ?  Closed fracture of multiple pubic rami, right, initial encounter ([place]) ?Active Problems: ?  Frequent falls ?  Alzheimer's disease with late onset [facility]) ?  Adjustment disorder with depressed mood ?  History of TIA (transient ischemic attack) ?  Essential hypertension ?  Hypercholesterolemia ?  ?Sara Perkins is a 86 y.o. female with medical history significant for  Alzheimer's dementia, symptomatic bradycardia s/p PPM, history of CVA, HTN, HLD, adjustment disorder with depressed mood, osteoporosis, frequent falls, and failure to thrive who is admitted with right pelvic fractures after fall at home. ? ?Assessment and Plan: ?* Closed fracture of multiple pubic rami, right, initial encounter ([place]) ?Frequent falls ?Patient with likely fragility fracture after fall from standing resulting in superior and inferior right-sided pubic rami fracture with involvement of the anterior medial right acetabulum.  Discussed with orthopedics, Dr. Stann Mainland, management is nonoperative at this point and consists of pain control and weightbearing as tolerated. ?-Orthopedics to assess in a.m. ?-Continue judicious analgesics as needed ?-Weightbearing as tolerated, PT/OT ?-Fall precautions ?-Currently resides at ALF, may need SNF on discharge ? ?Alzheimer's disease with late onset [facility]) ?Significant memory deficits.  High risk for delirium. ?-Continue home donepezil  and Namenda ?-Delirium precautions ?-CODE STATUS is DNR per discussion with family on admission ? ?Hypercholesterolemia ?Continue atorvastatin. ? ?Essential hypertension ?No longer on antihypertensives.

## 2021-11-25 NOTE — ED Notes (Signed)
Pt gone to ct 

## 2021-11-26 DIAGNOSIS — S32591A Other specified fracture of right pubis, initial encounter for closed fracture: Secondary | ICD-10-CM | POA: Diagnosis not present

## 2021-11-26 DIAGNOSIS — I10 Essential (primary) hypertension: Secondary | ICD-10-CM

## 2021-11-26 DIAGNOSIS — F0280 Dementia in other diseases classified elsewhere without behavioral disturbance: Secondary | ICD-10-CM

## 2021-11-26 DIAGNOSIS — R296 Repeated falls: Secondary | ICD-10-CM

## 2021-11-26 DIAGNOSIS — F4321 Adjustment disorder with depressed mood: Secondary | ICD-10-CM | POA: Diagnosis not present

## 2021-11-26 DIAGNOSIS — G301 Alzheimer's disease with late onset: Secondary | ICD-10-CM | POA: Diagnosis not present

## 2021-11-26 DIAGNOSIS — Z8673 Personal history of transient ischemic attack (TIA), and cerebral infarction without residual deficits: Secondary | ICD-10-CM

## 2021-11-26 DIAGNOSIS — E7800 Pure hypercholesterolemia, unspecified: Secondary | ICD-10-CM

## 2021-11-26 LAB — BASIC METABOLIC PANEL
Anion gap: 6 (ref 5–15)
BUN: 14 mg/dL (ref 8–23)
CO2: 26 mmol/L (ref 22–32)
Calcium: 8.5 mg/dL — ABNORMAL LOW (ref 8.9–10.3)
Chloride: 104 mmol/L (ref 98–111)
Creatinine, Ser: 0.81 mg/dL (ref 0.44–1.00)
GFR, Estimated: >60 mL/min (ref >60)
Glucose, Bld: 120 mg/dL — ABNORMAL HIGH (ref 70–99)
Potassium: 3.8 mmol/L (ref 3.5–5.1)
Sodium: 136 mmol/L (ref 135–145)

## 2021-11-26 LAB — CBC
HCT: 34.2 % — ABNORMAL LOW (ref 36.0–46.0)
Hemoglobin: 11.1 g/dL — ABNORMAL LOW (ref 12.0–15.0)
MCH: 30.8 pg (ref 26.0–34.0)
MCHC: 32.5 g/dL (ref 30.0–36.0)
MCV: 95 fL (ref 80.0–100.0)
Platelets: 150 10*3/uL (ref 150–400)
RBC: 3.6 MIL/uL — ABNORMAL LOW (ref 3.87–5.11)
RDW: 14.9 % (ref 11.5–15.5)
WBC: 8.7 10*3/uL (ref 4.0–10.5)
nRBC: 0 % (ref 0.0–0.2)

## 2021-11-26 MED ORDER — HALOPERIDOL LACTATE 5 MG/ML IJ SOLN
2.0000 mg | Freq: Four times a day (QID) | INTRAMUSCULAR | Status: DC | PRN
  Administered 2021-11-26: 2 mg via INTRAVENOUS
  Filled 2021-11-26: qty 1

## 2021-11-26 NOTE — ED Notes (Signed)
Found pt attempting to get out of the bed. Attempted to place pt back in bed and she swung at this RN and almost hit this RN in the head.  ?

## 2021-11-26 NOTE — Consult Note (Signed)
? ?ORTHOPAEDIC CONSULTATION ? ?REQUESTING PHYSICIAN: Pokhrel, Corrie Mckusick, MD ? ?PCP:  Lajean Manes, MD ? ?Chief Complaint: Fall, difficulty with weightbearing ? ?HPI: ?Sara Perkins is a 86 y.o. female who presents to the ER following an unwitnessed fall at her nursing facility.  Patient has history of Alzheimer's and is on palliative services.  She reports that she fell, is unsure how.  Does report some pain mostly with motion.  Denies numbness or tingling.  Denies fever or chills. ? ?Past Medical History:  ?Diagnosis Date  ? Alzheimer disease ([place])   ? Broken foot 2017  ? Chicken pox   ? Syncope   ? Related to BP medication   ? TIA (transient ischemic attack)   ? UTI (urinary tract infection)   ? ?Past Surgical History:  ?Procedure Laterality Date  ? ABDOMINAL HYSTERECTOMY    ? APPENDECTOMY    ? PACEMAKER GENERATOR CHANGE  11/25/2016  ? [employer] L 101 generator change performed in [place] in [place]  ? PACEMAKER INSERTION  12/24/2008  ? MDT PPM implanted by Dr Joeseph Amor in [place]   ? ?Social History  ? ?Socioeconomic History  ? Marital status: Married  ?  Spouse name: Not on file  ? Number of children: Not on file  ? Years of education: Not on file  ? Highest education level: Not on file  ?Occupational History  ? Not on file  ?Tobacco Use  ? Smoking status: Never  ?  Passive exposure: Never  ? Smokeless tobacco: Never  ?Vaping Use  ? Vaping Use: Never used  ?Substance and Sexual Activity  ? Alcohol use: Never  ? Drug use: Never  ? Sexual activity: Not Currently  ?Other Topics Concern  ? Not on file  ?Social History Narrative  ? Lives in [place] with son  ? Widowed  ? 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 ?Social Determinants of Health  ? ?Financial Resource Strain: Not on file  ?Food Insecurity: Not on file  ?Transportation Needs: Not on file  ?Physical Activity: Not on file  ?Stress: Not on file  ?Social Connections: Not on file  ? ?Family History  ?Problem Relation Age of Onset  ? Kidney disease Father    ? ?No Known Allergies ?Prior to Admission medications   ?Medication Sig Start Date End Date Taking? Authorizing Provider  ?alendronate (FOSAMAX) 70 MG tablet Take 70 mg by mouth every Friday. 04/17/21  Yes [provider]  ?atorvastatin (LIPITOR) 10 MG tablet TAKE 1 TABLET BY MOUTH  DAILY ?Patient taking differently: Take 10 mg by mouth daily. 05/25/19  Yes Luetta Nutting, DO  ?calcium-vitamin D (OSCAL WITH D) 500-200 MG-UNIT tablet Take 1 tablet by mouth every evening. With dinner   Yes [provider]  ?Cholecalciferol (VITAMIN D) 125 MCG (5000 UT) CAPS Take 1 capsule by mouth daily. ?Patient taking differently: Take 5,000 Units by mouth daily. 05/24/21  Yes Little Ishikawa, MD  ?clopidogrel (PLAVIX) 75 MG tablet TAKE 1 TABLET BY MOUTH  DAILY ?Patient taking differently: Take 75 mg by mouth daily. 02/04/19  Yes Luetta Nutting, DO  ?divalproex (DEPAKOTE SPRINKLE) 125 MG capsule Take 125 mg by mouth 3 (three) times daily. 05/22/21  Yes [provider]  ?donepezil (ARICEPT) 10 MG tablet Take 10 mg by mouth daily. 05/22/21  Yes [provider]  ?escitalopram (LEXAPRO) 10 MG tablet Take 10 mg by mouth daily.   Yes [provider]  ?HYDROcodone-acetaminophen (NORCO) 5-325 MG tablet Take 1 tablet by mouth every  4 (four) hours as needed. ?Patient taking differently: Take 1 tablet by mouth every 4 (four) hours as needed (pain). 06/14/21  Yes Delo, Nathaneil Canary, MD  ?melatonin 5 MG TABS Take 5 mg by mouth at bedtime.   Yes [provider]  ?memantine (NAMENDA) 10 MG tablet Take 10 mg by mouth 2 (two) times daily. 05/22/21  Yes [provider]  ?Multiple Vitamins-Minerals (CENTRUM ADULTS) TABS Take 1 tablet by mouth daily.   Yes [provider]  ?potassium chloride SA (K-DUR,KLOR-CON) 20 MEQ tablet Take 1 tablet (20 mEq total) by mouth daily. 11/23/18  Yes Luetta Nutting, DO  ?vitamin B-12 (CYANOCOBALAMIN) 1000 MCG tablet Take 1,000 mcg by mouth in the  morning and at bedtime.   Yes [provider]  ? ?DG Pelvis 1-2 Views ? ?Result Date: 11/25/2021 ?CLINICAL DATA:  Status post fall. EXAM: PELVIS - 1-2 VIEW COMPARISON:  November 01, 2021 FINDINGS: Ill-defined fracture deformities are seen involving the right superior and right inferior pubic rami. This is a new finding when compared to the prior study. There is no evidence of dislocation. No pelvic bone lesions are seen. IMPRESSION: Nondisplaced fractures of the right superior and right inferior pubic rami. CT correlation is recommended. Electronically Signed   By: Virgina Norfolk M.D.   On: 11/25/2021 19:51  ? ?CT Head Wo Contrast ? ?Result Date: 11/25/2021 ?CLINICAL DATA:  Mental status change, unknown cause EXAM: CT HEAD WITHOUT CONTRAST TECHNIQUE: Contiguous axial images were obtained from the base of the skull through the vertex without intravenous contrast. RADIATION DOSE REDUCTION: This exam was performed according to the departmental dose-optimization program which includes automated exposure control, adjustment of the mA and/or kV according to patient size and/or use of iterative reconstruction technique. COMPARISON:  11/01/2021 FINDINGS: Brain: Confluent hypodensities are again seen throughout the periventricular and subcortical white matter, consistent with chronic small vessel ischemic change. No evidence of acute infarct or hemorrhage. Lateral ventricles and midline structures are stable. No acute extra-axial fluid collections. No mass effect. Vascular: Stable atherosclerosis.  No hyperdense vessel. Skull: Normal. Negative for fracture or focal lesion. Sinuses/Orbits: No acute finding. Other: None. IMPRESSION: 1. No acute intracranial process. Stable chronic small vessel ischemic changes. Electronically Signed   By: Randa Ngo M.D.   On: 11/25/2021 19:17  ? ?CT PELVIS WO CONTRAST ? ?Result Date: 11/25/2021 ?CLINICAL DATA:  Evaluate for pelvic fracture. EXAM: CT PELVIS WITHOUT CONTRAST  TECHNIQUE: Multidetector CT imaging of the pelvis was performed following the standard protocol without intravenous contrast. RADIATION DOSE REDUCTION: This exam was performed according to the departmental dose-optimization program which includes automated exposure control, adjustment of the mA and/or kV according to patient size and/or use of iterative reconstruction technique. COMPARISON:  None. FINDINGS: Urinary Tract: Mild thickening of the anterior wall of the urinary bladder is noted. Bowel:  Unremarkable visualized pelvic bowel loops. Vascular/Lymphatic: No pathologically enlarged lymph nodes. No significant vascular abnormality seen. Reproductive: The uterus is surgically absent. The bilateral adnexa are unremarkable. Other:  None. Musculoskeletal: Acute fractures of the right superior and right inferior pubic rami are seen. Extension to involve the anterior medial aspect of the right acetabulum is noted. There is no evidence of dislocation. IMPRESSION: Acute fractures of the right superior and right inferior pubic rami with extension to involve the anterior medial aspect of the right acetabulum. Electronically Signed   By: Virgina Norfolk M.D.   On: 11/25/2021 21:20   ? ?Positive ROS: All other systems have been reviewed and were  otherwise negative with the exception of those mentioned in the HPI and as above. ? ?Physical Exam: ?General: Alert, no acute distress ?Cardiovascular: No pedal edema ?Respiratory: No cyanosis, no use of accessory musculature ?GI: No organomegaly, abdomen is soft and non-tender ?Skin: No lesions in the area of chief complaint ?Neurologic: Sensation intact distally ?Psychiatric:normal mood and affect, impaired memory ?Lymphatic: No axillary or cervical lymphadenopathy ? ?MUSCULOSKELETAL:  ?Left Lower Extremity: ? ?Inspection: ?No swelling or erythema of the foot/ankle, knee, hip ? ?No tenderness to the foot /ankle, knee, lateral hip. ? ?Mild discomfort with logroll.  Mild pain  with flexion extension of the knee. ?MMT:  intact extensor mech ?No calf swelling or tenderness  ?Distal ankle and foot strength normal ?Sensation is intact to light touch ?Extremity is warm and well perfused ? ?Right

## 2021-11-26 NOTE — TOC CAGE-AID Note (Signed)
Transition of Care (TOC) - CAGE-AID Screening ? ? ?Patient Details  ?Name: Sara Perkins ?MRN: [id number] ?Date of Birth: 09-Jun-1936 ? ?Transition of Care (TOC) CM/SW Contact:    ?Poppy Mcafee C Tarpley-Carter, LCSWA ?Phone Number: ?11/26/2021, 9:28 AM ? ? ?Clinical Narrative: ?Pt is unable to participate in Cage Aid. ?Pt is experiencing dementia. ? ?Passenger transport manager, MSW, LCSW-A ?Pronouns:  She/Her/Hers ?[facility] ?Clinical Social Worker ?Direct Number:  [phone] ?[email or web address] ? ?CAGE-AID Screening: ?Substance Abuse Screening unable to be completed due to: : Patient unable to participate ? ?  ?  ?  ?  ?  ? ?  ? ?  ? ? ? ? ? ? ?

## 2021-11-26 NOTE — Progress Notes (Signed)
?PROGRESS NOTE ? ? ? ?Sara Perkins  JKK:[id number] DOB: [birth year] DOA: 11/25/2021 ?PCP: Lajean Manes, MD  ? ? ?Brief Narrative:  ?Sara Perkins is a 86 y.o. female with past medical history of dementia, symptomatic bradycardia status post pacemaker placement, history of CVA, hypertension, hyperlipidemia, adjustment disorder with depressed mood, osteoporosis, frequent falls and failure to thrive was admitted to the hospital for right pelvic fracture after fall.Patient is currently at his assisted living facility level of care. ? ?Assessment and Plan: ? ?* Closed fracture of multiple pubic rami, right, initial encounter ([place]) ?Frequent falls ?Orthopedics was consulted.  Plan is  nonoperative treatment at this time.  Weightbearing as tolerated.  We will follow PT, OT.  Patient is currently at assisted living facility.  Might need skilled nursing facility at discharge.   ? ?Alzheimer's disease with late onset ([place]) ?Significant memory deficits.  High risk for delirium.  Continue donepezil and Namenda. ?  ?Hypercholesterolemia ?Continue atorvastatin. ?  ?Essential hypertension ?Not on antihypertensives. ?  ?History of TIA (transient ischemic attack) ?Continue atorvastatin and Plavix. ?  ?Adjustment disorder with depressed mood ?Continue home Lexapro and Depakote. ?  ? ? DVT prophylaxis: heparin injection 5,000 Units Start: 11/26/21 0600 ? ? ?Code Status:   ?  Code Status: DNR ? ?Disposition: Patient is from assisted living facility.  Might need a skilled nursing level of care at this time.  We will get PT evaluation. ? ?Status is: Inpatient ?Remains inpatient appropriate because: Pubic bone fracture, possible need for skilled nursing facility placement, ? ? Family Communication: Tried to reach the patient's son on the phone but was unable to reach him today. ? ?Consultants:  ?Orthopedics ? ?Procedures:  ?None ? ?Antimicrobials:  ?None ? ?Anti-infectives (From admission, onward)  ? ? None  ? ?   ? ? ?Subjective: ?Today, patient was seen and examined at bedside.  Poor historian.  Patient complains of mild pain in the pelvic area.  No nausea vomiting shortness of breath. ? ?Objective: ?Vitals:  ? 11/26/21 0700 11/26/21 0900 11/26/21 1000 11/26/21 1400  ?BP:  (!) 125/98 135/62 (!) 117/49  ?Pulse:  63 66 64  ?Resp:  '16 16 17  '$ ?Temp:      ?TempSrc:      ?SpO2: 99% 96% 95% 97%  ? ?No intake or output data in the 24 hours ending 11/26/21 1500 ?There were no vitals filed for this visit. ? ?Physical Examination: ? ?General:  Average built,  elderly female not in obvious distress ?HENT:   No scleral pallor or icterus noted. Oral mucosa is moist.  ?Chest:  Clear breath sounds.  Diminished breath sounds bilaterally. No crackles or wheezes.  ?CVS: S1 &S2 heard. No murmur.  Regular rate and rhythm. ?Abdomen: Soft, nontender, nondistended.  Bowel sounds are heard.   ?Extremities: No cyanosis, clubbing or edema.  Peripheral pulses are palpable.  Restricted hip movement due to pelvic fracture. ?Psych: Alert, awake, has underlying dementia, oriented to self.  Flat affect. ?CNS:  No cranial nerve deficits.  Power equal in all extremities.   ?Skin: Warm and dry.  No rashes noted. ? ?Data Reviewed:  ? ?CBC: ?Recent Labs  ?Lab 11/25/21 ?2030 11/26/21 ?0449  ?WBC 10.5 8.7  ?NEUTROABS 8.4*  --   ?HGB 12.2 11.1*  ?HCT 35.8* 34.2*  ?MCV 93.2 95.0  ?PLT 157 150  ? ? ?Basic Metabolic Panel: ?Recent Labs  ?Lab 11/25/21 ?2030 11/26/21 ?0449  ?NA 135 136  ?K 4.0 3.8  ?CL 104 104  ?CO2 22  26  ?GLUCOSE 139* 120*  ?BUN 13 14  ?CREATININE 0.84 0.81  ?CALCIUM 8.5* 8.5*  ? ? ?Liver Function Tests: ?Recent Labs  ?Lab 11/25/21 ?2030  ?AST 31  ?ALT 17  ?ALKPHOS 54  ?BILITOT 1.3*  ?PROT 5.7*  ?ALBUMIN 3.1*  ? ? ? ?Radiology Studies: ?DG Pelvis 1-2 Views ? ?Result Date: 11/25/2021 ?CLINICAL DATA:  Status post fall. EXAM: PELVIS - 1-2 VIEW COMPARISON:  November 01, 2021 FINDINGS: Ill-defined fracture deformities are seen involving the right superior  and right inferior pubic rami. This is a new finding when compared to the prior study. There is no evidence of dislocation. No pelvic bone lesions are seen. IMPRESSION: Nondisplaced fractures of the right superior and right inferior pubic rami. CT correlation is recommended. Electronically Signed   By: Virgina Norfolk M.D.   On: 11/25/2021 19:51  ? ?CT Head Wo Contrast ? ?Result Date: 11/25/2021 ?CLINICAL DATA:  Mental status change, unknown cause EXAM: CT HEAD WITHOUT CONTRAST TECHNIQUE: Contiguous axial images were obtained from the base of the skull through the vertex without intravenous contrast. RADIATION DOSE REDUCTION: This exam was performed according to the departmental dose-optimization program which includes automated exposure control, adjustment of the mA and/or kV according to patient size and/or use of iterative reconstruction technique. COMPARISON:  11/01/2021 FINDINGS: Brain: Confluent hypodensities are again seen throughout the periventricular and subcortical white matter, consistent with chronic small vessel ischemic change. No evidence of acute infarct or hemorrhage. Lateral ventricles and midline structures are stable. No acute extra-axial fluid collections. No mass effect. Vascular: Stable atherosclerosis.  No hyperdense vessel. Skull: Normal. Negative for fracture or focal lesion. Sinuses/Orbits: No acute finding. Other: None. IMPRESSION: 1. No acute intracranial process. Stable chronic small vessel ischemic changes. Electronically Signed   By: Randa Ngo M.D.   On: 11/25/2021 19:17  ? ?CT PELVIS WO CONTRAST ? ?Result Date: 11/25/2021 ?CLINICAL DATA:  Evaluate for pelvic fracture. EXAM: CT PELVIS WITHOUT CONTRAST TECHNIQUE: Multidetector CT imaging of the pelvis was performed following the standard protocol without intravenous contrast. RADIATION DOSE REDUCTION: This exam was performed according to the departmental dose-optimization program which includes automated exposure control,  adjustment of the mA and/or kV according to patient size and/or use of iterative reconstruction technique. COMPARISON:  None. FINDINGS: Urinary Tract: Mild thickening of the anterior wall of the urinary bladder is noted. Bowel:  Unremarkable visualized pelvic bowel loops. Vascular/Lymphatic: No pathologically enlarged lymph nodes. No significant vascular abnormality seen. Reproductive: The uterus is surgically absent. The bilateral adnexa are unremarkable. Other:  None. Musculoskeletal: Acute fractures of the right superior and right inferior pubic rami are seen. Extension to involve the anterior medial aspect of the right acetabulum is noted. There is no evidence of dislocation. IMPRESSION: Acute fractures of the right superior and right inferior pubic rami with extension to involve the anterior medial aspect of the right acetabulum. Electronically Signed   By: Virgina Norfolk M.D.   On: 11/25/2021 21:20   ? ? ? LOS: 1 day  ? ? ?Flora Lipps, MD ?[facility] ?Available via Epic secure chat 7am-7pm ?After these hours, please refer to coverage provider listed on [email or web address] ?11/26/2021, 3:00 PM  ?  ?

## 2021-11-26 NOTE — ED Notes (Signed)
Spoke with son Sherren Mocha at this time per son pt can normally answer certain question like her name and dob but that is it. Per son pt will normally take her medications when they are due ?

## 2021-11-26 NOTE — ED Notes (Signed)
Pt is alert but confused. She has pulled off her bp cuff, pulse ox cord, and her purewick. This RN has tried to explain to pt the importance of leaving stuff on but pt remains confused and pulling at things. When this RN gets to close to pt pt continues to threaten and ball her fist up and attempts to swing at this RN ?

## 2021-11-26 NOTE — Progress Notes (Signed)
[facility] ED 0 37 Hospitalized Hospice patient. Hospital liaison note.  ? ?Sara Perkins is a current hospice patient with [facility]. She has a CTI of hypertensive heart disease. She was transported to the ED by EMS with a reported unwitnessed fall by her [facility]. This is a related admission. ? ?Visited patient at bedside. She was resting during visit and did not arouse when name called. This Liaison exchanged report with bedside RN that stated there were no events overnight and patient has been resting comfortably. She was able to drink a small amount of juice and eat a few bites of breakfast.  ? ?VS: 135/62, 66, 16, 95% [place] 2L ?Labs: ? Latest Reference Range & Units 11/26/21 04:49  ?BASIC METABOLIC PANEL  Rpt !  ?Sodium 135 - 145 mmol/L 136  ?Potassium 3.5 - 5.1 mmol/L 3.8  ?Chloride 98 - 111 mmol/L 104  ?CO2 22 - 32 mmol/L 26  ?Glucose 70 - 99 mg/dL 120 (H)  ?BUN 8 - 23 mg/dL 14  ?Creatinine 0.44 - 1.00 mg/dL 0.81  ?Calcium 8.9 - 10.3 mg/dL 8.5 (L)  ?Anion gap 5 - 15  6  ?GFR, Estimated >60 mL/min >60  ?WBC 4.0 - 10.5 K/uL 8.7  ?RBC 3.87 - 5.11 MIL/uL 3.60 (L)  ?Hemoglobin 12.0 - 15.0 g/dL 11.1 (L)  ?HCT 36.0 - 46.0 % 34.2 (L)  ?MCV 80.0 - 100.0 fL 95.0  ?MCH 26.0 - 34.0 pg 30.8  ?MCHC 30.0 - 36.0 g/dL 32.5  ?RDW 11.5 - 15.5 % 14.9  ?Platelets 150 - 400 K/uL 150  ?nRBC 0.0 - 0.2 % 0.0  ? ?Diagnostics: ?IMPRESSION: ?Acute fractures of the right superior and right inferior pubic rami ?with extension to involve the anterior medial aspect of the right ?acetabulum. ?  ?  ?Electronically Signed ?  By: Virgina Norfolk M.D. ?  On: 11/25/2021 21:20 ?  ?Problem List: ?Closed fracture of multiple pubic rami, right, initial encounter ([place]) ?Frequent falls ?Patient with likely fragility fracture after fall from standing resulting in superior and inferior right-sided pubic rami fracture with involvement of the anterior medial right acetabulum.  Discussed with orthopedics, Dr. Stann Mainland, management is nonoperative at this point and  consists of pain control and weightbearing as tolerated. ?-Orthopedics to assess in a.m. ?-Continue judicious analgesics as needed ?-Weightbearing as tolerated, PT/OT ?-Fall precautions ?-Currently resides at ALF, may need SNF on discharge ?  ?Alzheimer's disease with late onset [facility]) ?Significant memory deficits.  High risk for delirium. ?-Continue home donepezil and Namenda ?-Delirium precautions ?-CODE STATUS is DNR per discussion with family on admission ?  ?Hypercholesterolemia ?Continue atorvastatin. ?  ?Essential hypertension ?No longer on antihypertensives.  BP stable, continue to monitor. ?  ?History of TIA (transient ischemic attack) ?Continue home atorvastatin and Plavix. ?  ?Adjustment disorder with depressed mood ?Continue home Lexapro and Depakote. ? ?Medications: ?Heparin SQ 5000units, PRN oxycodone '5mg'$  PO Q6 PN 2345. ? ?Patient is being admitted for exam findings of fractures and safety, this pt remains appropriate at this time for GIP level.  ? ?GOC: DNR, possible need for SNF vs ALF ?Family: Son Sara Perkins updated by phone ?[id number] ?Discharge: Waiting on PT visit to see plan moving forward.  ? ?Should patient need ambulance transport at discharge please use [facility] as [facility] contracts this service with them for our active hospice patients. ? ?Clementeen Hoof, BSN, RN ?Hospital Liaison ?[phone] ? ?

## 2021-11-26 NOTE — ED Notes (Signed)
Pt drank a small amount of cranberry juice and had two bit of pancake with assistance from this nurse ? ?

## 2021-11-26 NOTE — ED Notes (Signed)
Pt ate 1/4 [place] and gravy, 1/4 of mash potatoes, 1/4 of veggies and 2 spoonfuls of chocolate pudding. She also drank 1/2 cup of water. All with assistance from this nurse ?

## 2021-11-26 NOTE — ED Notes (Signed)
Pt drinking cup of water without assistance ? ?

## 2021-11-26 NOTE — ED Notes (Signed)
Pt ate the rest of the chocolate pudding cup. With assistance from this nurse ? ?

## 2021-11-26 NOTE — ED Notes (Signed)
Verbal report received from [place] at this time ?

## 2021-11-26 NOTE — ED Notes (Signed)
Pt continues to threaten this RN and raise her fist and swings at this RN. Pt refuses all PO medication states that she has never heard of them so she is not taking them ?

## 2021-11-26 NOTE — ED Notes (Signed)
Pt care assumed. Pt noted to be sleeping in the bed with even regular unlabored respirations. O2 on pt.  ?

## 2021-11-26 NOTE — ED Notes (Signed)
Request sent to pharmacy for missing meds ?

## 2021-11-26 NOTE — ED Notes (Signed)
This RN requested from pt to take her medication. Pt agitated and uncooperative at this time. Refused all medication.  ?

## 2021-11-26 NOTE — ED Notes (Signed)
Entered room to check on pt. Found pt had removed her [place] at this time. Explained to pt the importance of leaving the oxygen on. Pt threatened to hit this RN at this time. Explained to pt that she was in the hospital and pt advised this RN to get out of her house that she didn't hire me. Admit provider pt and received a phone call back and advise he would place new orders ?

## 2021-11-27 ENCOUNTER — Encounter (HOSPITAL_COMMUNITY): Payer: Self-pay | Admitting: Internal Medicine

## 2021-11-27 ENCOUNTER — Other Ambulatory Visit: Payer: Self-pay

## 2021-11-27 DIAGNOSIS — Z8673 Personal history of transient ischemic attack (TIA), and cerebral infarction without residual deficits: Secondary | ICD-10-CM

## 2021-11-27 DIAGNOSIS — S32591A Other specified fracture of right pubis, initial encounter for closed fracture: Secondary | ICD-10-CM | POA: Diagnosis not present

## 2021-11-27 DIAGNOSIS — I10 Essential (primary) hypertension: Secondary | ICD-10-CM

## 2021-11-27 DIAGNOSIS — F0280 Dementia in other diseases classified elsewhere without behavioral disturbance: Secondary | ICD-10-CM

## 2021-11-27 DIAGNOSIS — F4321 Adjustment disorder with depressed mood: Secondary | ICD-10-CM

## 2021-11-27 DIAGNOSIS — E7800 Pure hypercholesterolemia, unspecified: Secondary | ICD-10-CM

## 2021-11-27 DIAGNOSIS — G301 Alzheimer's disease with late onset: Secondary | ICD-10-CM

## 2021-11-27 DIAGNOSIS — R296 Repeated falls: Secondary | ICD-10-CM

## 2021-11-27 LAB — CBC
HCT: 35 % — ABNORMAL LOW (ref 36.0–46.0)
Hemoglobin: 11.5 g/dL — ABNORMAL LOW (ref 12.0–15.0)
MCH: 31.8 pg (ref 26.0–34.0)
MCHC: 32.9 g/dL (ref 30.0–36.0)
MCV: 96.7 fL (ref 80.0–100.0)
Platelets: 142 10*3/uL — ABNORMAL LOW (ref 150–400)
RBC: 3.62 MIL/uL — ABNORMAL LOW (ref 3.87–5.11)
RDW: 14.6 % (ref 11.5–15.5)
WBC: 7.6 10*3/uL (ref 4.0–10.5)
nRBC: 0 % (ref 0.0–0.2)

## 2021-11-27 NOTE — Progress Notes (Signed)
PT Cancellation Note ? ?Patient Details ?Name: Sara Perkins ?MRN: [id number] ?DOB: 09/10/1935 ? ? ?Cancelled Treatment:    Reason Eval/Treat Not Completed:  (Nurse asked PT to HOLD therapy. Pt confused and agitated. Will return at later date.) ? ? ?[name] ?11/27/2021, 12:02 PMDawn M,PT ?Acute Rehab Services ?[phone] ?[phone] (pager)  ? ? ?

## 2021-11-27 NOTE — ED Notes (Signed)
RN assisted pt with eating breakfast and taking morning meds at this time  ?

## 2021-11-27 NOTE — Progress Notes (Signed)
?PROGRESS NOTE ? ? ? ?Lael Gude  MQK:[id number] DOB: 05/30/1936 DOA: 11/25/2021 ?PCP: Lajean Manes, MD  ? ? ?Brief Narrative:  ?Minela Supan is a 86 y.o. female with past medical history of dementia, symptomatic bradycardia status post pacemaker placement, history of CVA, hypertension, hyperlipidemia, adjustment disorder with depressed mood, osteoporosis, frequent falls and failure to thrive was admitted to the hospital for right pelvic fracture after fall.Patient is currently at his assisted living facility level of care. ? ?Assessment and Plan: ? ?* Closed fracture of multiple pubic rami, right, initial encounter ([place]) ?Frequent falls ?Orthopedics was consulted.  Plan is  nonoperative treatment at this time.  Weightbearing as tolerated.  We will follow PT, OT.  Patient is currently at assisted living facility.  Might need skilled nursing facility at discharge.   ? ?Alzheimer's disease with late onset ([place]) ?Significant memory deficits.  High risk for delirium.  Continue donepezil and Namenda. ?  ?Hypercholesterolemia ?Continue atorvastatin. ?  ?Essential hypertension ?Not on antihypertensives. ?  ?History of TIA (transient ischemic attack) ?Continue atorvastatin and Plavix. ?  ?Adjustment disorder with depressed mood ?Continue home Lexapro and Depakote. ?  ? ? DVT prophylaxis: heparin injection 5,000 Units Start: 11/26/21 0600 ? ? ?Code Status:   ?  Code Status: DNR ? ?Disposition:  ?Patient is from assisted living facility.  Might need a skilled nursing level of care at this time.  Pending PT evaluation. ? ?Status is: Inpatient ? ?Remains inpatient appropriate because: Pubic bone fracture, possible need for skilled nursing facility placement, ? ? Family Communication:  ?I tried to call the patient's son listed in the computer again today but was unable to reach. ? ?Consultants:  ?Orthopedics ? ?Procedures:  ?None ? ?Antimicrobials:  ?None ? ?Anti-infectives (From admission, onward)  ? ? None  ? ?   ? ?Subjective: ?Today, patient was seen and examined at bedside.  Appears to be sleepy.  Received  pain medication this morning.  Patient was little agitated and confused yesterday.   ? ?Objective: ?Vitals:  ? 11/26/21 2013 11/27/21 0329 11/27/21 0700 11/27/21 0800  ?BP: (!) 149/64 (!) 135/53 (!) 144/55 (!) 144/56  ?Pulse: 79 74 61 69  ?Resp: '16 16  16  '$ ?Temp:      ?TempSrc:      ?SpO2: 99% 98% 97% 97%  ? ?No intake or output data in the 24 hours ending 11/27/21 1038 ?There were no vitals filed for this visit. ? ?Physical Examination: ? ?General:  Average built, not in obvious distress, elderly female, somnolent ?HENT:   No scleral pallor or icterus noted. Oral mucosa is moist.  ?Chest:  Clear breath sounds.  Diminished breath sounds bilaterally. No crackles or wheezes.  ?CVS: S1 &S2 heard. No murmur.  Regular rate and rhythm. ?Abdomen: Soft, nontender, nondistended.  Bowel sounds are heard.   ?Extremities: No cyanosis, clubbing or edema.  Peripheral pulses are palpable. ?Psych: Somnolent, ?CNS: Moves all extremities. ?Skin: Warm and dry.  No rashes noted. ? ?Data Reviewed:  ? ?CBC: ?Recent Labs  ?Lab 11/25/21 ?2030 11/26/21 ?0449  ?WBC 10.5 8.7  ?NEUTROABS 8.4*  --   ?HGB 12.2 11.1*  ?HCT 35.8* 34.2*  ?MCV 93.2 95.0  ?PLT 157 150  ? ? ?Basic Metabolic Panel: ?Recent Labs  ?Lab 11/25/21 ?2030 11/26/21 ?0449  ?NA 135 136  ?K 4.0 3.8  ?CL 104 104  ?CO2 22 26  ?GLUCOSE 139* 120*  ?BUN 13 14  ?CREATININE 0.84 0.81  ?CALCIUM 8.5* 8.5*  ? ? ?Liver Function  Tests: ?Recent Labs  ?Lab 11/25/21 ?2030  ?AST 31  ?ALT 17  ?ALKPHOS 54  ?BILITOT 1.3*  ?PROT 5.7*  ?ALBUMIN 3.1*  ? ? ? ?Radiology Studies: ?DG Pelvis 1-2 Views ? ?Result Date: 11/25/2021 ?CLINICAL DATA:  Status post fall. EXAM: PELVIS - 1-2 VIEW COMPARISON:  November 01, 2021 FINDINGS: Ill-defined fracture deformities are seen involving the right superior and right inferior pubic rami. This is a new finding when compared to the prior study. There is no evidence of  dislocation. No pelvic bone lesions are seen. IMPRESSION: Nondisplaced fractures of the right superior and right inferior pubic rami. CT correlation is recommended. Electronically Signed   By: Virgina Norfolk M.D.   On: 11/25/2021 19:51  ? ?CT Head Wo Contrast ? ?Result Date: 11/25/2021 ?CLINICAL DATA:  Mental status change, unknown cause EXAM: CT HEAD WITHOUT CONTRAST TECHNIQUE: Contiguous axial images were obtained from the base of the skull through the vertex without intravenous contrast. RADIATION DOSE REDUCTION: This exam was performed according to the departmental dose-optimization program which includes automated exposure control, adjustment of the mA and/or kV according to patient size and/or use of iterative reconstruction technique. COMPARISON:  11/01/2021 FINDINGS: Brain: Confluent hypodensities are again seen throughout the periventricular and subcortical white matter, consistent with chronic small vessel ischemic change. No evidence of acute infarct or hemorrhage. Lateral ventricles and midline structures are stable. No acute extra-axial fluid collections. No mass effect. Vascular: Stable atherosclerosis.  No hyperdense vessel. Skull: Normal. Negative for fracture or focal lesion. Sinuses/Orbits: No acute finding. Other: None. IMPRESSION: 1. No acute intracranial process. Stable chronic small vessel ischemic changes. Electronically Signed   By: Randa Ngo M.D.   On: 11/25/2021 19:17  ? ?CT PELVIS WO CONTRAST ? ?Result Date: 11/25/2021 ?CLINICAL DATA:  Evaluate for pelvic fracture. EXAM: CT PELVIS WITHOUT CONTRAST TECHNIQUE: Multidetector CT imaging of the pelvis was performed following the standard protocol without intravenous contrast. RADIATION DOSE REDUCTION: This exam was performed according to the departmental dose-optimization program which includes automated exposure control, adjustment of the mA and/or kV according to patient size and/or use of iterative reconstruction technique. COMPARISON:   None. FINDINGS: Urinary Tract: Mild thickening of the anterior wall of the urinary bladder is noted. Bowel:  Unremarkable visualized pelvic bowel loops. Vascular/Lymphatic: No pathologically enlarged lymph nodes. No significant vascular abnormality seen. Reproductive: The uterus is surgically absent. The bilateral adnexa are unremarkable. Other:  None. Musculoskeletal: Acute fractures of the right superior and right inferior pubic rami are seen. Extension to involve the anterior medial aspect of the right acetabulum is noted. There is no evidence of dislocation. IMPRESSION: Acute fractures of the right superior and right inferior pubic rami with extension to involve the anterior medial aspect of the right acetabulum. Electronically Signed   By: Virgina Norfolk M.D.   On: 11/25/2021 21:20   ? ? ? LOS: 2 days  ? ? ?Flora Lipps, MD ?[facility] ?Available via Epic secure chat 7am-7pm ?After these hours, please refer to coverage provider listed on [email or web address] ?11/27/2021, 10:38 AM  ?  ?

## 2021-11-27 NOTE — ED Notes (Signed)
Admit provider made aware that pt refused po meds earlier, that she will not let us obtain VS on her, and that she continues to remove her [place] after it has been placed multiple times ?

## 2021-11-27 NOTE — Progress Notes (Signed)
Admission documentation unable to complete at this time due to patient's confusion and no family present at bedside. ?

## 2021-11-27 NOTE — ED Notes (Signed)
Breakfast order placed ?

## 2021-11-27 NOTE — Plan of Care (Signed)

## 2021-11-27 NOTE — Progress Notes (Signed)
Received patient from ED via stretcher.  Patient is sleeping, wakes up to voice.  Transferred to bed, assisted in position of comfort.  O2 on at 1L/[place].  Patient appears in no pain or distress at this time.  Needs addressed.  ?

## 2021-11-27 NOTE — ED Notes (Signed)
Attempted to get VS and place pt back on [place] and pt refused pulling everything back off at this time. O2 sats are currently 84% ?

## 2021-11-27 NOTE — Progress Notes (Signed)
[facility] ED 0 37 Hospitalized Hospice patient. Hospital liaison note.  ?  ?Sara Perkins is a current hospice patient with [facility]. She has a CTI of hypertensive heart disease. She was transported to the ED by EMS with a reported unwitnessed fall by her [facility]. This is a related admission. ? ?Patient sleeping and did wake to voice at visit. No apparent distress. No family present. Per report she confused and can be agitated when awake.  ? ?Appropriate for inpatient care to manage pain, symptoms and evaluate ability to participate in PT.  ? ?VS: 97.6, 133/52, 66, 18, 99% 3Lnc ?I/O: not recorded ? ?No new labs or diagnostics. ? ?IV/PRN medications: oxycodone '5mg'$  PO PRN @ 0836 ? ?Problem list: ?* Closed fracture of multiple pubic rami, right, initial encounter ([place]) ?Frequent falls ?Orthopedics was consulted.  Plan is  nonoperative treatment at this time.  Weightbearing as tolerated.  We will follow PT, OT.  Patient is currently at assisted living facility.  Might need skilled nursing facility at discharge.   ?  ?Alzheimer's disease with late onset ([place]) ?Significant memory deficits.  High risk for delirium.  Continue donepezil and Namenda. ?  ?Hypercholesterolemia ?Continue atorvastatin. ?  ?Essential hypertension ?Not on antihypertensives. ?  ?History of TIA (transient ischemic attack) ?Continue atorvastatin and Plavix. ?  ?Adjustment disorder with depressed mood ?Continue home Lexapro and Depakote. ?  ?DVT prophylaxis: heparin injection 5,000 Units Start: 11/26/21 0600 ? ?GOC: DNR, possible need for SNF vs ALF ?Family: Son Sherren Mocha updated by phone ?[id number] ?Discharge: Waiting on PT evaluation to see plan moving forward.  ?  ?Should patient need ambulance transport at discharge please use [facility] as [facility] contracts this service with them for our active hospice patients. ?  ?Please do not hesitate to call with questions.   ?Thank you,   ?Farrel Gordon, RN, CCM      ?[facility] Hospital Liaison   ?[phone] ? ? ? ?

## 2021-11-28 DIAGNOSIS — I10 Essential (primary) hypertension: Secondary | ICD-10-CM | POA: Diagnosis not present

## 2021-11-28 DIAGNOSIS — F4321 Adjustment disorder with depressed mood: Secondary | ICD-10-CM | POA: Diagnosis not present

## 2021-11-28 DIAGNOSIS — S32591A Other specified fracture of right pubis, initial encounter for closed fracture: Secondary | ICD-10-CM | POA: Diagnosis not present

## 2021-11-28 DIAGNOSIS — G301 Alzheimer's disease with late onset: Secondary | ICD-10-CM | POA: Diagnosis not present

## 2021-11-28 DIAGNOSIS — E876 Hypokalemia: Secondary | ICD-10-CM

## 2021-11-28 LAB — BASIC METABOLIC PANEL
Anion gap: 7 (ref 5–15)
BUN: 16 mg/dL (ref 8–23)
CO2: 27 mmol/L (ref 22–32)
Calcium: 8.2 mg/dL — ABNORMAL LOW (ref 8.9–10.3)
Chloride: 102 mmol/L (ref 98–111)
Creatinine, Ser: 0.8 mg/dL (ref 0.44–1.00)
GFR, Estimated: >60 mL/min (ref >60)
Glucose, Bld: 115 mg/dL — ABNORMAL HIGH (ref 70–99)
Potassium: 3.4 mmol/L — ABNORMAL LOW (ref 3.5–5.1)
Sodium: 136 mmol/L (ref 135–145)

## 2021-11-28 LAB — CBC
HCT: 35.8 % — ABNORMAL LOW (ref 36.0–46.0)
Hemoglobin: 11.9 g/dL — ABNORMAL LOW (ref 12.0–15.0)
MCH: 31.7 pg (ref 26.0–34.0)
MCHC: 33.2 g/dL (ref 30.0–36.0)
MCV: 95.5 fL (ref 80.0–100.0)
Platelets: 161 10*3/uL (ref 150–400)
RBC: 3.75 MIL/uL — ABNORMAL LOW (ref 3.87–5.11)
RDW: 14.2 % (ref 11.5–15.5)
WBC: 8.2 10*3/uL (ref 4.0–10.5)
nRBC: 0 % (ref 0.0–0.2)

## 2021-11-28 LAB — MAGNESIUM: Magnesium: 2.1 mg/dL (ref 1.7–2.4)

## 2021-11-28 MED ORDER — POTASSIUM CHLORIDE CRYS ER 20 MEQ PO TBCR
40.0000 meq | EXTENDED_RELEASE_TABLET | Freq: Once | ORAL | Status: AC
  Administered 2021-11-28: 40 meq via ORAL
  Filled 2021-11-28: qty 2

## 2021-11-28 NOTE — Progress Notes (Addendum)
[place] patient. Hospital liaison note.  ?  ?Sara Perkins is a current hospice patient with [facility]. She has a CTI of hypertensive heart disease. She was transported to the ED by EMS with a reported unwitnessed fall by her [facility]. This is a related admission. ?  ?Appropriate for inpatient care to manage pain, symptoms and evaluate ability to participate in PT. ? ?Sitting up in chair eating lunch. She is alert but confused to time and situation.  PT evaluated today and recommends short term rehab in SNF.  ? ?VS: 98.0, 124/44, 67, 18, 97% 1Lnc ?I/O: 240/250 ? ?Abnormal labs: ?11/28/21 02:47 ?Potassium: 3.4 (L) ?Glucose: 115 (H) ?Calcium: 8.2 (L) ?RBC: 3.75 (L) ?Hemoglobin: 11.9 (L) ?HCT: 35.8 (L) ? ?IV/PRN medications: oxycodone '5mg'$  PO PRN @ 1008 ? ?Problem list: ?Closed fracture of multiple pubic rami, right, initial encounter ([place]) ?Frequent falls ?Orthopedics was consulted.  Plan is  nonoperative treatment at this time.  Weightbearing as tolerated.  We will follow PT, OT.  Patient is currently at assisted living facility.  Might need skilled nursing facility at discharge.  PT OT assessment pending. ?  ?Alzheimer's disease with late onset ([place]) ?Significant memory deficits.  High risk for delirium.  Continue donepezil and Namenda. ?  ?Hypercholesterolemia ?Continue atorvastatin. ?  ?Essential hypertension ?Not on antihypertensives. ?  ?History of TIA (transient ischemic attack) ?Continue atorvastatin and Plavix. ?  ?Adjustment disorder with depressed mood ?Continue home Lexapro and Depakote. ?  ?Hypokalemia.  Mild.  We will replace orally.  Check levels in a.m. ?  ?Hospice care patient. ?  ?GOC: DNR. Rehab then return to [place] with hospice. ?Family: spoke with son Sherren Mocha by phone.  ?UDJ:[id number] ?Discharge planning: SNF for short term rehab then return to [place]. Family would like to revoke hospice and have palliative follow while patient is in SNF.  ?  ?Please do not hesitate to call with  questions.   ? ?Thank you,   ?Farrel Gordon, RN, CCM      ?[facility] Hospital Liaison   ?[phone] ?  ?  ?

## 2021-11-28 NOTE — Progress Notes (Signed)
[facility]) Hospital Liaison note: ? ?This patient has been referred to [facility] Outpatient  Palliative Care services. Will continue to follow for disposition. ? ?Please call with any outpatient palliative questions or concerns. ? ?Thank you for the opportunity to participate in this patient's care. ?  ?Thank you, ?Lorelee Market, LPN ?[facility] Hospital Liaison ?[phone] ?

## 2021-11-28 NOTE — TOC Initial Note (Signed)
Transition of Care (TOC) - Initial/Assessment Note  ? ? ?Patient Details  ?Name: Sara Perkins ?MRN: [id number] ?Date of Birth: March 15, 1936 ? ?Transition of Care [facility]) CM/SW Contact:    ?Joanne Chars, LCSW ?Phone Number: ?11/28/2021, 3:40 PM ? ?Clinical Narrative:     Pt oriented x1, CSW spoke briefly with pt in room, spoke to son Sherren Mocha over the phone.  Pt is in assisted living at [facility].  Discussed PT eval/recommendations for SNF and closer supervision. [name] in agreement with this plan.  He is going to speak with [facility] about possible move to memory care when pt completes SNF.  Pt is also active with [facility] but only for a few weeks.   ? ?Choice discussed with Sherren Mocha, directed to [facility] [place] website.  Permission given to send out referral in hub, which was done.  PASSR went to level 2.              ? ? ?Expected Discharge Plan: [place] ?Barriers to Discharge: Continued Medical Work up, SNF Pending bed offer ? ? ?Patient Goals and CMS Choice ?  ?  ?Choice offered to / list presented to : Adult Children (son Sherren Mocha over the phone) ? ?Expected Discharge Plan and Services ?Expected Discharge Plan: [place] ?In-house Referral: Clinical Social Work ?  ?Post Acute Care Choice: [place] ?Living arrangements for the past 2 months: [place] ([facility]) ?                ?  ?  ?  ?  ?  ?  ?  ?  ?  ?  ? ?Prior Living Arrangements/Services ?Living arrangements for the past 2 months: [place] ([facility]) ?Lives with:: Facility Resident ?  ?       ?Need for Family Participation in Patient Care: Yes (Comment) ?Care giver support system in place?: Yes (comment) ?Current home services: Other (comment) (na) ?Criminal Activity/Legal Involvement Pertinent to Current Situation/Hospitalization: No - Comment as needed ? ?Activities of Daily Living ?Home Assistive Devices/Equipment: Other (Comment) (patient  resides in an ALF) ?ADL Screening (condition at time of admission) ?Patient's cognitive ability adequate to safely complete daily activities?: No ?Is the patient deaf or have difficulty hearing?: No ?Does the patient have difficulty seeing, even when wearing glasses/contacts?: No ?Does the patient have difficulty concentrating, remembering, or making decisions?: Yes ?Patient able to express need for assistance with ADLs?: Yes ?Does the patient have difficulty dressing or bathing?: Yes ?Independently performs ADLs?: No ?Communication: Independent ?Dressing (OT): Needs assistance ?Is this a change from baseline?: Pre-admission baseline ?Grooming: Needs assistance ?Is this a change from baseline?: Pre-admission baseline ?Feeding: Needs assistance ?Is this a change from baseline?: Pre-admission baseline ?Toileting: Needs assistance ?Is this a change from baseline?: Pre-admission baseline ?In/Out Bed: Needs assistance ?Is this a change from baseline?: Pre-admission baseline ?Walks in Home: Needs assistance ?Is this a change from baseline?: Pre-admission baseline ?Does the patient have difficulty walking or climbing stairs?: Yes ?Weakness of Legs: Both ?Weakness of Arms/Hands: None ? ?Permission Sought/Granted ?  ?  ?   ?   ?   ?   ? ?Emotional Assessment ?Appearance:: Appears stated age ?Attitude/Demeanor/Rapport: Engaged ?Affect (typically observed): Appropriate ?Orientation: : Oriented to Self ?Alcohol / Substance Use: Not Applicable ?Psych Involvement: No (comment) ? ?Admission diagnosis:  Fall, initial encounter [W19.XXXA] ?Closed fracture of multiple pubic rami, right, initial encounter ([place]) [S32.591A] ?Multiple closed fractures of pelvis, initial encounter (  [place]) [S32.82XA] ?Patient Active Problem List  ? Diagnosis Date Noted  ? Hypokalemia 11/28/2021  ? Closed fracture of multiple pubic rami, right, initial encounter ([place]) 11/25/2021  ? Alzheimer's disease with late onset ([place]) 11/02/2021  ? Chronic pain  11/02/2021  ? Essential hypertension 11/02/2021  ? Hypercholesterolemia 11/02/2021  ? Primary insomnia 11/02/2021  ? NSTEMI (non-ST elevated myocardial infarction) ([place]) 05/22/2021  ? Frequent falls 05/22/2021  ? Pacemaker 04/27/2018  ? Memory change 01/28/2018  ? Adjustment disorder with depressed mood 01/28/2018  ? History of TIA (transient ischemic attack) 01/28/2018  ? ?PCP:  Lajean Manes, MD ?Pharmacy:  No Pharmacies Listed ? ? ? ?Social Determinants of Health (SDOH) Interventions ?  ? ?Readmission Risk Interventions ? ?  11/02/2021  ?  2:37 PM  ?Readmission Risk Prevention Plan  ?Transportation Screening Complete  ?Home Care Screening Complete  ?Medication Review (RN CM) Complete  ? ? ? ?

## 2021-11-28 NOTE — Progress Notes (Addendum)
?PROGRESS NOTE ? ? ? ?Sara Perkins  AJG:[id number] DOB: May 12, 1936 DOA: 11/25/2021 ?PCP: Lajean Manes, MD  ? ? ?Brief Narrative:  ?Sara Perkins is a 86 y.o. female with past medical history of dementia, symptomatic bradycardia status post pacemaker placement, history of CVA, hypertension, hyperlipidemia, adjustment disorder with depressed mood, osteoporosis, frequent falls and failure to thrive was admitted to the hospital for right pelvic fracture after fall.Patient is currently at his assisted living facility level of care. ? ?Assessment and Plan: ? ?* Closed fracture of multiple pubic rami, right, initial encounter ([place]) ?Frequent falls ?Orthopedics was consulted.  Plan is  nonoperative treatment at this time.  Weightbearing as tolerated.  We will follow PT, OT.  Patient is currently at assisted living facility.  Might need skilled nursing facility at discharge.  PT OT assessment pending. ? ?Alzheimer's disease with late onset ([place]) ?Significant memory deficits.  High risk for delirium.  Continue donepezil and Namenda. ?  ?Hypercholesterolemia ?Continue atorvastatin. ?  ?Essential hypertension ?Not on antihypertensives. ?  ?History of TIA (transient ischemic attack) ?Continue atorvastatin and Plavix. ?  ?Adjustment disorder with depressed mood ?Continue home Lexapro and Depakote. ? ?Hypokalemia.  Mild.  We will replace orally.  Check levels in a.m. ? ?Hospice care patient. ?  ? ? DVT prophylaxis: heparin injection 5,000 Units Start: 11/26/21 0600 ? ?Code Status:   ?  Code Status: DNR ? ?Disposition:  ?Patient is from assisted living facility.  Might need skilled nursing level of care at this time.  Pending PT evaluation.  Medically stable for disposition. ? ?Status is: Inpatient ? ?Remains inpatient appropriate because: Pubic bone fracture, possible need for skilled nursing facility placement, ? ? Family Communication:  ?I spoke with the patient's son and updated her about the clinical condition of the  patient.  ? ?Consultants:  ?Orthopedics ? ?Procedures:  ?None ? ?Antimicrobials:  ?None ? ?Anti-infectives (From admission, onward)  ? ? None  ? ?  ? ?Subjective: ?Today, patient was seen and examined at bedside.  Appears to be more alert awake and communicative.  Denies overt pain.  Poor historian. ? ?Objective: ?Vitals:  ? 11/27/21 1214 11/27/21 2036 11/28/21 0440 11/28/21 1327  ?BP: (!) 133/52 (!) 140/53 (!) 124/44 131/60  ?Pulse: 66 70 67 69  ?Resp: '18 18 18 18  '$ ?Temp: 97.6 ?F (36.4 ?C) 98.6 ?F (37 ?C) 98 ?F (36.7 ?C) 98.3 ?F (36.8 ?C)  ?TempSrc: Oral Oral Oral   ?SpO2: 99% 96% 97% 95%  ?Weight: 64.5 kg     ?Height: '5\' 6"'$  (1.676 m)     ? ? ?Intake/Output Summary (Last 24 hours) at 11/28/2021 1427 ?Last data filed at 11/27/2021 2132 ?Gross per 24 hour  ?Intake 240 ml  ?Output 250 ml  ?Net -10 ml  ? ?Filed Weights  ? 11/27/21 1214  ?Weight: 64.5 kg  ? ? ?Physical Examination: ?Body mass index is 22.95 kg/m?.  ? ?General:  Average built, not in obvious distress afebrile, more alert awake and communicative today. ?HENT:   No scleral pallor or icterus noted. Oral mucosa is moist.  ?Chest:  Clear breath sounds.  Diminished breath sounds bilaterally. No crackles or wheezes.  ?CVS: S1 &S2 heard. No murmur.  Regular rate and rhythm. ?Abdomen: Soft, nontender, nondistended.  Bowel sounds are heard.   ?Extremities: No cyanosis, clubbing or edema.  Peripheral pulses are palpable. ?Psych: Alert, awake and communicative underlying dementia ?CNS:  No cranial nerve deficits.  Power equal in all extremities.   ?Skin: Warm and dry.  No rashes noted. ? ? ?Data Reviewed:  ? ?CBC: ?Recent Labs  ?Lab 11/25/21 ?2030 11/26/21 ?0449 11/27/21 ?1115 11/28/21 ?0247  ?WBC 10.5 8.7 7.6 8.2  ?NEUTROABS 8.4*  --   --   --   ?HGB 12.2 11.1* 11.5* 11.9*  ?HCT 35.8* 34.2* 35.0* 35.8*  ?MCV 93.2 95.0 96.7 95.5  ?PLT 157 150 142* 161  ? ? ?Basic Metabolic Panel: ?Recent Labs  ?Lab 11/25/21 ?2030 11/26/21 ?0449 11/28/21 ?0247  ?NA 135 136 136  ?K 4.0  3.8 3.4*  ?CL 104 104 102  ?CO2 '22 26 27  '$ ?GLUCOSE 139* 120* 115*  ?BUN '13 14 16  '$ ?CREATININE 0.84 0.81 0.80  ?CALCIUM 8.5* 8.5* 8.2*  ?MG  --   --  2.1  ? ? ?Liver Function Tests: ?Recent Labs  ?Lab 11/25/21 ?2030  ?AST 31  ?ALT 17  ?ALKPHOS 54  ?BILITOT 1.3*  ?PROT 5.7*  ?ALBUMIN 3.1*  ? ? ? ?Radiology Studies: ?No results found. ? ? ? LOS: 3 days  ? ? ?Flora Lipps, MD ?[facility] ?Available via Epic secure chat 7am-7pm ?After these hours, please refer to coverage provider listed on [email or web address] ?11/28/2021, 2:27 PM  ?  ?

## 2021-11-28 NOTE — Evaluation (Signed)
Occupational Therapy Evaluation ?Patient Details ?Name: Sara Perkins ?MRN: [id number] ?DOB: 1936-06-17 ?Today's Date: 11/28/2021 ? ? ?History of Present Illness Pt is an 86 y/o female who presents s/p unwitnessed fall at facility, sustaining acute fractures of the right superior and right inferior pubic rami with extension to involve the anterior medial aspect of the right acetabulum. PMH significant for alzheimer's disease, TIA, broken foot 2017, pacemaker.  ? ?Clinical Impression ?  ?Pt admitted for concerns listed above. PTA pt reported that she was fairly independent, however unsure of accuracy due to alzheimer's disease. At this time, pt is requiring mod A +2 for all OOB mobility, as well as mod-max verbal cuing for safety and sequencing. Pt able to complete seated tasks with min A for verbal cuing. Recommending SNF for pt safety and to promote maintaining level of independence. OT will continue to follow acutely.   ?   ? ?Recommendations for follow up therapy are one component of a multi-disciplinary discharge planning process, led by the attending physician.  Recommendations may be updated based on patient status, additional functional criteria and insurance authorization.  ? ?Follow Up Recommendations ? Skilled nursing-short term rehab (<3 hours/day)  ?  ?Assistance Recommended at Discharge Frequent or constant Supervision/Assistance  ?Patient can return home with the following A lot of help with walking and/or transfers;A lot of help with bathing/dressing/bathroom;Assistance with cooking/housework;Direct supervision/assist for medications management;Direct supervision/assist for financial management;Assist for transportation;Help with stairs or ramp for entrance ? ?  ?Functional Status Assessment ? Patient has had a recent decline in their functional status and/or demonstrates limited ability to make significant improvements in function in a reasonable and predictable amount of time  ?Equipment  Recommendations ? None recommended by OT  ?  ?Recommendations for Other Services   ? ? ?  ?Precautions / Restrictions Precautions ?Precautions: Fall (frequent falls at ALF) ?Precaution Comments: Combative at times per nursing staff ?Restrictions ?Weight Bearing Restrictions: Yes ?RLE Weight Bearing: Weight bearing as tolerated  ? ?  ? ?Mobility Bed Mobility ?Overal bed mobility: Needs Assistance ?Bed Mobility: Supine to Sit ?  ?  ?Supine to sit: Mod assist, +2 for physical assistance, HOB elevated ?  ?  ?General bed mobility comments: +2 assist for full transition to EOB. Bed pad utilized to assist with scooting. Upon initial sitting, pt with heavy R lateral lean. ?  ? ?Transfers ?Overall transfer level: Needs assistance ?Equipment used: Rolling walker (2 wheels) ?Transfers: Sit to/from Stand, Bed to chair/wheelchair/BSC ?Sit to Stand: +2 physical assistance, Mod assist ?  ?  ?Step pivot transfers: +2 physical assistance, Min assist ?  ?  ?General transfer comment: +2 assist for balance support, safety, and walker management as pt took steps around from bed to chair. ?  ? ?  ?Balance Overall balance assessment: Needs assistance ?Sitting-balance support: Single extremity supported, Feet supported ?Sitting balance-Leahy Scale: Poor ?Sitting balance - Comments: R lateral lean ?  ?Standing balance support: Bilateral upper extremity supported, Reliant on assistive device for balance ?Standing balance-Leahy Scale: Poor ?  ?  ?  ?  ?  ?  ?  ?  ?  ?  ?  ?  ?   ? ?ADL either performed or assessed with clinical judgement  ? ?ADL Overall ADL's : Needs assistance/impaired ?Eating/Feeding: Set up;Sitting ?  ?Grooming: Minimal assistance;Sitting ?  ?Upper Body Bathing: Minimal assistance;Sitting ?  ?Lower Body Bathing: Moderate assistance;+2 for physical assistance;+2 for safety/equipment;Cueing for sequencing;Sitting/lateral leans;Sit to/from stand ?  ?Upper Body Dressing : Minimal  assistance;Sitting ?  ?Lower Body Dressing:  Moderate assistance;+2 for physical assistance;+2 for safety/equipment;Cueing for sequencing;Sitting/lateral leans;Sit to/from stand ?  ?Toilet Transfer: Moderate assistance;+2 for physical assistance;+2 for safety/equipment;Stand-pivot ?  ?Toileting- Clothing Manipulation and Hygiene: Moderate assistance;+2 for physical assistance;+2 for safety/equipment;Cueing for sequencing;Sitting/lateral lean;Sit to/from stand ?  ?  ?  ?Functional mobility during ADLs: Moderate assistance;+2 for physical assistance;+2 for safety/equipment;Rolling walker (2 wheels) ?General ADL Comments: Requiring assist for all ADL's due to weakness, pain, and difficulty sequencing for safety.  ? ? ? ?Vision Baseline Vision/History: 1 Wears glasses ?Ability to See in Adequate Light: 0 Adequate ?Patient Visual Report: No change from baseline ?Vision Assessment?: No apparent visual deficits  ?   ?Perception   ?  ?Praxis   ?  ? ?Pertinent Vitals/Pain Pain Assessment ?Pain Assessment: Faces ?Faces Pain Scale: Hurts little more ?Pain Location: R hip. Pt stating "ow" with bearing weight or scooting the R side however continually attempting to take further steps. ?Pain Descriptors / Indicators: Operative site guarding, Grimacing ?Pain Intervention(s): Limited activity within patient's tolerance, Monitored during session, Repositioned  ? ? ? ?Hand Dominance Right ?  ?Extremity/Trunk Assessment Upper Extremity Assessment ?Upper Extremity Assessment: Generalized weakness ?  ?Lower Extremity Assessment ?Lower Extremity Assessment: Defer to PT evaluation ?  ?Cervical / Trunk Assessment ?Cervical / Trunk Assessment: Kyphotic ?  ?Communication Communication ?Communication: No difficulties ?  ?Cognition Arousal/Alertness: Awake/alert ?Behavior During Therapy: Flat affect ?Overall Cognitive Status: History of cognitive impairments - at baseline ?  ?  ?  ?  ?  ?  ?  ?  ?  ?  ?  ?  ?  ?  ?  ?  ?General Comments: Alzheimer's disease ?  ?  ?General Comments  VSS  on 1L ? ?  ?Exercises   ?  ?Shoulder Instructions    ? ? ?Home Living Family/patient expects to be discharged to:: Assisted living ?  ?  ?  ?  ?  ?  ?  ?  ?  ?  ?  ?  ?  ?  ?Home Equipment: Kasandra Knudsen - single point ?  ?  ?  ? ?  ?Prior Functioning/Environment Prior Level of Function : Needs assist ?  ?  ?  ?  ?  ?  ?  ?ADLs Comments: staff for cleaning and cooking, pt states she performs ADLs on her own but unsure of accuracy given cognitive deficits. Information taken from prior admission. ?  ? ?  ?  ?OT Problem List: Decreased strength;Decreased activity tolerance;Impaired balance (sitting and/or standing);Decreased coordination;Decreased cognition;Decreased safety awareness;Decreased knowledge of precautions;Pain ?  ?   ?OT Treatment/Interventions: Self-care/ADL training;Therapeutic exercise;Energy conservation;DME and/or AE instruction;Therapeutic activities;Cognitive remediation/compensation;Patient/family education;Balance training  ?  ?OT Goals(Current goals can be found in the care plan section) Acute Rehab OT Goals ?Patient Stated Goal: none stated ?OT Goal Formulation: Patient unable to participate in goal setting ?Time For Goal Achievement: 12/12/21 ?Potential to Achieve Goals: Fair ?ADL Goals ?Pt Will Perform Grooming: with supervision;standing ?Pt Will Perform Lower Body Bathing: with min assist;sitting/lateral leans;sit to/from stand ?Pt Will Perform Lower Body Dressing: with min assist;sitting/lateral leans;sit to/from stand ?Pt Will Transfer to Toilet: with min guard assist;ambulating ?Pt Will Perform Toileting - Clothing Manipulation and hygiene: with min assist;sitting/lateral leans;sit to/from stand  ?OT Frequency: Min 2X/week ?  ? ?Co-evaluation PT/OT/SLP Co-Evaluation/Treatment: Yes ?Reason for Co-Treatment: Complexity of the patient's impairments (multi-system involvement);Necessary to address cognition/behavior during functional activity;For patient/therapist safety;To address functional/ADL  transfers ?  ?OT goals  addressed during session: ADL's and self-care;Strengthening/ROM ?  ? ?  ?AM-PAC OT "6 Clicks" Daily Activity     ?Outcome Measure Help from another person eating meals?: A Little ?Help from another p

## 2021-11-28 NOTE — Progress Notes (Signed)
? ? ?  RE:  Sara Perkins       ?Date of Birth: Aug 03, 1936      ?Date:  11/28/21      ? ? ?To Whom It May Concern: ? ?Please be advised that the above-named patient will require a short-term nursing home stay - anticipated 30 days or less for rehabilitation and strengthening.  The plan is for return home. ? ? ?              ?MD signature ? ?              ?Date ?

## 2021-11-28 NOTE — Care Management Important Message (Signed)
Important Message ? ?Patient Details  ?Name: Sara Perkins ?MRN: [id number] ?Date of Birth: [birth year] ? ? ?Medicare Important Message Given:  Yes ?Due to illness patient could not sign copy of document left at patient bedside.  ? ? ?Sara Perkins ?11/28/2021, 3:25 PM ?

## 2021-11-28 NOTE — [place] FL2 (Signed)
?[place] MEDICAID FL2 LEVEL OF CARE SCREENING TOOL  ?  ? ?IDENTIFICATION  ?Patient Name: ?Sara Perkins Birthdate: [birth year] Sex: female Admission Date (Current Location): ?11/25/2021  ?[place] and [place] Number: ? [place] ?  Facility and Address:  ?[facility], [place] [address] [place], [place] [address] ?     Provider Number: ?[id number]  ?Attending Physician Name and Address:  ?Flora Lipps, MD ? Relative Name and Phone Number:  ?[place], [place]   [phone] ?   ?Current Level of Care: ?Hospital Recommended Level of Care: ?[place] Prior Approval Number: ?  ? ?Date Approved/Denied: ?  PASRR Number: ?  ? ?Discharge Plan: ?SNF ?  ? ?Current Diagnoses: ?Patient Active Problem List  ? Diagnosis Date Noted  ? Hypokalemia 11/28/2021  ? Closed fracture of multiple pubic rami, right, initial encounter ([place]) 11/25/2021  ? Alzheimer's disease with late onset ([place]) 11/02/2021  ? Chronic pain 11/02/2021  ? Essential hypertension 11/02/2021  ? Hypercholesterolemia 11/02/2021  ? Primary insomnia 11/02/2021  ? NSTEMI (non-ST elevated myocardial infarction) ([place]) 05/22/2021  ? Frequent falls 05/22/2021  ? Pacemaker 04/27/2018  ? Memory change 01/28/2018  ? Adjustment disorder with depressed mood 01/28/2018  ? History of TIA (transient ischemic attack) 01/28/2018  ? ? ?Orientation RESPIRATION BLADDER Height & Weight   ?  ?Self ? O2 Incontinent, External catheter Weight: 142 lb 3.2 oz (64.5 kg) ?Height:  '5\' 6"'$  (167.6 cm)  ?BEHAVIORAL SYMPTOMS/MOOD NEUROLOGICAL BOWEL NUTRITION STATUS  ?    Continent Diet (see discharge summary)  ?AMBULATORY STATUS COMMUNICATION OF NEEDS Skin   ?Total Care Verbally Other (Comment) (dry, flaky) ?  ?  ?  ?    ?     ?     ? ? ?Personal Care Assistance Level of Assistance  ?Bathing, Feeding, Dressing Bathing Assistance: Limited assistance ?Feeding assistance: Limited assistance ?Dressing Assistance: Limited assistance ?   ? ?Functional Limitations Info   ?Sight, Hearing, Speech Sight Info: Adequate ?Hearing Info: Adequate ?Speech Info: Adequate  ? ? ?SPECIAL CARE FACTORS FREQUENCY  ?PT (By licensed PT), OT (By licensed OT)   ?  ?PT Frequency: 5x week ?OT Frequency: 5x week ?  ?  ?  ?   ? ? ?Contractures Contractures Info: Not present  ? ? ?Additional Factors Info  ?Code Status, Allergies Code Status Info: DNR ?Allergies Info: NKA ?  ?  ?  ?   ? ?Current Medications (11/28/2021):  This is the current hospital active medication list ?Current Facility-Administered Medications  ?Medication Dose Route Frequency Provider Last Rate Last Admin  ? acetaminophen (TYLENOL) tablet 1,000 mg  1,000 mg Oral Q6H PRN Lenore Cordia, MD      ? Or  ? acetaminophen (TYLENOL) suppository 650 mg  650 mg Rectal Q6H PRN Lenore Cordia, MD      ? atorvastatin (LIPITOR) tablet 10 mg  10 mg Oral Daily Lenore Cordia, MD   10 mg at 11/28/21 1008  ? clopidogrel (PLAVIX) tablet 75 mg  75 mg Oral Daily Lenore Cordia, MD   75 mg at 11/28/21 1008  ? divalproex (DEPAKOTE SPRINKLE) capsule 125 mg  125 mg Oral TID Lenore Cordia, MD   125 mg at 11/28/21 1105  ? donepezil (ARICEPT) tablet 10 mg  10 mg Oral Daily Lenore Cordia, MD   10 mg at 11/28/21 1008  ? escitalopram (LEXAPRO) tablet 10 mg  10 mg Oral Daily Lenore Cordia, MD   10 mg  at 11/28/21 1008  ? haloperidol lactate (HALDOL) injection 2-4 mg  2-4 mg Intravenous Q6H PRN Etta Quill, DO   2 mg at 11/26/21 2150  ? heparin injection 5,000 Units  5,000 Units Subcutaneous Q8H Lenore Cordia, MD   5,000 Units at 11/28/21 6759  ? melatonin tablet 5 mg  5 mg Oral QHS Lenore Cordia, MD   5 mg at 11/27/21 2123  ? memantine (NAMENDA) tablet 10 mg  10 mg Oral BID Lenore Cordia, MD   10 mg at 11/28/21 1008  ? morphine (PF) 2 MG/ML injection 1 mg  1 mg Intravenous Q4H PRN Lenore Cordia, MD      ? ondansetron (ZOFRAN) tablet 4 mg  4 mg Oral Q6H PRN Lenore Cordia, MD      ? Or  ? ondansetron (ZOFRAN) injection 4 mg  4 mg Intravenous Q6H  PRN Lenore Cordia, MD      ? oxyCODONE (Oxy IR/ROXICODONE) immediate release tablet 5 mg  5 mg Oral Q6H PRN Lenore Cordia, MD   5 mg at 11/28/21 1008  ? potassium chloride SA (KLOR-CON M) CR tablet 40 mEq  40 mEq Oral Once Pokhrel, L[place], MD      ? senna-docusate (Senokot-S) tablet 1 tablet  1 tablet Oral QHS PRN Lenore Cordia, MD      ? ? ? ?Discharge Medications: ?Please see discharge summary for a list of discharge medications. ? ?Relevant Imaging Results: ? ?Relevant Lab Results: ? ? ?Additional Information ?[phone]. Pt is vaccinated for covid with 2-3 boosters. ? ?Joanne Chars, LCSW ? ? ? ? ?

## 2021-11-28 NOTE — Evaluation (Signed)
Physical Therapy Evaluation ? ?Patient Details ?Name: Sara Perkins ?MRN: [id number] ?DOB: [birth year] ?Today's Date: 11/28/2021 ? ?History of Present Illness ? Pt is an 86 y/o female who presents s/p unwitnessed fall at facility, sustaining acute fractures of the right superior and right inferior pubic rami with extension to involve the anterior medial aspect of the right acetabulum. PMH significant for alzheimer's disease, TIA, broken foot 2017, pacemaker. ?  ?Clinical Impression ? Pt admitted with above diagnosis. Pt currently with functional limitations due to the deficits listed below (see PT Problem List). At the time of PT eval pt was able to perform transfers with up to +2 mod assist for balance support and safety with RW for support. Pt has had several falls at ALF, and with recent admission in the last month 2? another fall. Feel this patient is most appropriate for SNF level rehab at d/c with very frequent supervision. Acutely, pt will benefit from skilled PT to increase their independence and safety with mobility to allow discharge to the venue listed below.      ?   ? ?Recommendations for follow up therapy are one component of a multi-disciplinary discharge planning process, led by the attending physician.  Recommendations may be updated based on patient status, additional functional criteria and insurance authorization. ? ?Follow Up Recommendations Skilled nursing-short term rehab (<3 hours/day) ? ?  ?Assistance Recommended at Discharge Frequent or constant Supervision/Assistance  ?Patient can return home with the following ? Two people to help with walking and/or transfers;Assistance with cooking/housework;Assistance with feeding;Assist for transportation;Direct supervision/assist for medications management;Direct supervision/assist for financial management;Two people to help with bathing/dressing/bathroom ? ?  ?Equipment Recommendations Rolling walker (2 wheels);Other (comment) (TBD by next venue of  care)  ?Recommendations for Other Services ?    ?  ?Functional Status Assessment Patient has had a recent decline in their functional status and demonstrates the ability to make significant improvements in function in a reasonable and predictable amount of time.  ? ?  ?Precautions / Restrictions Precautions ?Precautions: Fall (frequent falls at ALF) ?Precaution Comments: Combative at times per nursing staff ?Restrictions ?Weight Bearing Restrictions: Yes ?RLE Weight Bearing: Weight bearing as tolerated  ? ?  ? ?Mobility ? Bed Mobility ?Overal bed mobility: Needs Assistance ?Bed Mobility: Supine to Sit ?  ?  ?Supine to sit: Mod assist, +2 for physical assistance, HOB elevated ?  ?  ?General bed mobility comments: +2 assist for full transition to EOB. Bed pad utilized to assist with scooting. Upon initial sitting, pt with heavy R lateral lean. ?  ? ?Transfers ?Overall transfer level: Needs assistance ?Equipment used: Rolling walker (2 wheels) ?Transfers: Sit to/from Stand, Bed to chair/wheelchair/BSC ?Sit to Stand: +2 physical assistance, Mod assist ?  ?Step pivot transfers: +2 physical assistance, Min assist ?  ?  ?  ?General transfer comment: +2 assist for balance support, safety, and walker management as pt took steps around from bed to chair. ?  ? ?Ambulation/Gait ?  ?  ?  ?  ?  ?  ?  ?General Gait Details: Unable to progress to gait training. ? ?Stairs ?  ?  ?  ?  ?  ? ?Wheelchair Mobility ?  ? ?Modified Rankin (Stroke Patients Only) ?  ? ?  ? ?Balance Overall balance assessment: Needs assistance ?Sitting-balance support: Single extremity supported, Feet supported ?Sitting balance-Leahy Scale: Poor ?Sitting balance - Comments: R lateral lean ?  ?Standing balance support: Bilateral upper extremity supported, Reliant on assistive device for balance ?Standing balance-Leahy  Scale: Poor ?  ?  ?  ?  ?  ?  ?  ?  ?  ?  ?  ?  ?   ? ? ? ?Pertinent Vitals/Pain Pain Assessment ?Pain Assessment: Faces ?Faces Pain Scale: Hurts  little more ?Pain Location: R hip. Pt stating "ow" with bearing weight or scooting the R side however continually attempting to take further steps. ?Pain Descriptors / Indicators: Operative site guarding, Grimacing ?Pain Intervention(s): Limited activity within patient's tolerance, Monitored during session, Repositioned  ? ? ?Home Living Family/patient expects to be discharged to:: Assisted living ?  ?  ?  ?  ?  ?  ?  ?  ?Home Equipment: Kasandra Knudsen - single point ?   ?  ?Prior Function Prior Level of Function : Needs assist ?  ?  ?  ?  ?  ?  ?  ?ADLs Comments: staff for cleaning and cooking, pt states she performs ADLs on her own but unsure of accuracy given cognitive deficits. Information taken from prior admission. ?  ? ? ?Hand Dominance  ? Dominant Hand: Right ? ?  ?Extremity/Trunk Assessment  ? Upper Extremity Assessment ?Upper Extremity Assessment: Defer to OT evaluation ?  ? ?Lower Extremity Assessment ?Lower Extremity Assessment: Generalized weakness;RLE deficits/detail ?RLE Deficits / Details: Acute pain, decreased strength and AROM consistent with pelvic fractures ?RLE: Unable to fully assess due to pain ?  ? ?Cervical / Trunk Assessment ?Cervical / Trunk Assessment: Kyphotic  ?Communication  ? Communication: No difficulties  ?Cognition Arousal/Alertness: Awake/alert ?Behavior During Therapy: Flat affect ?Overall Cognitive Status: History of cognitive impairments - at baseline ?  ?  ?  ?  ?  ?  ?  ?  ?  ?  ?  ?  ?  ?  ?  ?  ?General Comments: Alzheimer's disease ?  ?  ? ?  ?General Comments   ? ?  ?Exercises    ? ?Assessment/Plan  ?  ?PT Assessment Patient needs continued PT services  ?PT Problem List Decreased strength;Decreased activity tolerance;Decreased balance;Decreased mobility;Decreased cognition;Decreased knowledge of use of DME;Decreased safety awareness;Decreased knowledge of precautions;Cardiopulmonary status limiting activity;Pain ? ?   ?  ?PT Treatment Interventions DME instruction;Gait  training;Functional mobility training;Therapeutic activities;Therapeutic exercise;Balance training;Patient/family education   ? ?PT Goals (Current goals can be found in the Care Plan section)  ?Acute Rehab PT Goals ?Patient Stated Goal: None stated ?PT Goal Formulation: Patient unable to participate in goal setting ?Time For Goal Achievement: 12/12/21 ?Potential to Achieve Goals: Fair ? ?  ?Frequency Min 2X/week ?  ? ? ?Co-evaluation PT/OT/SLP Co-Evaluation/Treatment: Yes ?Reason for Co-Treatment: Complexity of the patient's impairments (multi-system involvement);Necessary to address cognition/behavior during functional activity;For patient/therapist safety;To address functional/ADL transfers ?PT goals addressed during session: Mobility/safety with mobility;Balance;Proper use of DME ?  ?  ? ? ?  ?AM-PAC PT "6 Clicks" Mobility  ?Outcome Measure Help needed turning from your back to your side while in a flat bed without using bedrails?: A Lot ?Help needed moving from lying on your back to sitting on the side of a flat bed without using bedrails?: A Lot ?Help needed moving to and from a bed to a chair (including a wheelchair)?: Total ?Help needed standing up from a chair using your arms (e.g., wheelchair or bedside chair)?: Total ?Help needed to walk in hospital room?: Total ?Help needed climbing 3-5 steps with a railing? : Total ?6 Click Score: 8 ? ?  ?End of Session Equipment Utilized During Treatment: Gait belt;Oxygen ?Activity  Tolerance: Patient tolerated treatment well ?Patient left: in chair;with call bell/phone within reach;with chair alarm set ?Nurse Communication: Mobility status;Need for lift equipment Charlaine Dalton) ?PT Visit Diagnosis: Pain;Difficulty in walking, not elsewhere classified (R26.2);Repeated falls (R29.6) ?Pain - Right/Left: Right ?Pain - part of body: Hip ?  ? ?Time: 2585-2778 ?PT Time Calculation (min) (ACUTE ONLY): 32 min ? ? ?Charges:   PT Evaluation ?$PT Eval Moderate Complexity: 1 Mod ?  ?  ?    ? ? ?Rolinda Roan, PT, DPT ?Acute Rehabilitation Services ?Secure Chat Preferred ?Office: [phone]  ? ?Sara Perkins ?11/28/2021, 3:20 PM ? ?

## 2021-11-29 DIAGNOSIS — S32591A Other specified fracture of right pubis, initial encounter for closed fracture: Secondary | ICD-10-CM | POA: Diagnosis not present

## 2021-11-29 DIAGNOSIS — G301 Alzheimer's disease with late onset: Secondary | ICD-10-CM | POA: Diagnosis not present

## 2021-11-29 DIAGNOSIS — F4321 Adjustment disorder with depressed mood: Secondary | ICD-10-CM | POA: Diagnosis not present

## 2021-11-29 DIAGNOSIS — E876 Hypokalemia: Secondary | ICD-10-CM

## 2021-11-29 DIAGNOSIS — I10 Essential (primary) hypertension: Secondary | ICD-10-CM | POA: Diagnosis not present

## 2021-11-29 LAB — BASIC METABOLIC PANEL
Anion gap: 9 (ref 5–15)
BUN: 20 mg/dL (ref 8–23)
CO2: 22 mmol/L (ref 22–32)
Calcium: 8.7 mg/dL — ABNORMAL LOW (ref 8.9–10.3)
Chloride: 105 mmol/L (ref 98–111)
Creatinine, Ser: 0.81 mg/dL (ref 0.44–1.00)
GFR, Estimated: >60 mL/min (ref >60)
Glucose, Bld: 144 mg/dL — ABNORMAL HIGH (ref 70–99)
Potassium: 4.6 mmol/L (ref 3.5–5.1)
Sodium: 136 mmol/L (ref 135–145)

## 2021-11-29 LAB — CBC
HCT: 39 % (ref 36.0–46.0)
Hemoglobin: 12.9 g/dL (ref 12.0–15.0)
MCH: 31.4 pg (ref 26.0–34.0)
MCHC: 33.1 g/dL (ref 30.0–36.0)
MCV: 94.9 fL (ref 80.0–100.0)
Platelets: 179 10*3/uL (ref 150–400)
RBC: 4.11 MIL/uL (ref 3.87–5.11)
RDW: 14.2 % (ref 11.5–15.5)
WBC: 9.8 10*3/uL (ref 4.0–10.5)
nRBC: 0 % (ref 0.0–0.2)

## 2021-11-29 LAB — MAGNESIUM: Magnesium: 2.2 mg/dL (ref 1.7–2.4)

## 2021-11-29 MED ORDER — SENNOSIDES-DOCUSATE SODIUM 8.6-50 MG PO TABS: 1.0000 | ORAL_TABLET | Freq: Every evening | ORAL | Status: AC | PRN

## 2021-11-29 MED ORDER — HYDROCODONE-ACETAMINOPHEN 5-325 MG PO TABS: 1.0000 | ORAL_TABLET | ORAL | 0 refills | Status: AC | PRN

## 2021-11-29 NOTE — Discharge Summary (Signed)
?Physician Discharge Summary ?  ?Patient: Sara Perkins MRN: [id number] DOB: 01-Oct-1935  ?Admit date:     11/25/2021  ?Discharge date: 11/29/21  ?Discharge Physician: Corrie Mckusick Marlisa Caridi  ? ?PCP: Lajean Manes, MD  ? ?Recommendations at discharge:  ? ?Follow-up with primary care provider at the skilled nursing facility in 3 to 5 days.  Check CBC BMP at that time. ?Patient would need continued physical therapy after discharge. ?Can follow-up with Dr. Stann [facility] in 2 to 3 weeks as needed. ? ?Discharge Diagnoses: ?Principal Problem: ?  Closed fracture of multiple pubic rami, right, initial encounter ([place]) ?Active Problems: ?  Frequent falls ?  Alzheimer's disease with late onset [facility]) ?  Adjustment disorder with depressed mood ?  History of TIA (transient ischemic attack) ?  Essential hypertension ?  Hypercholesterolemia ? ?Resolved Problems: ?  Hypokalemia ? ?Hospital Course: ?Sara Perkins is a 86 y.o. female with past medical history of dementia, symptomatic bradycardia status post pacemaker placement, history of CVA, hypertension, hyperlipidemia, adjustment disorder with depressed mood, osteoporosis, frequent falls and failure to thrive was admitted to the hospital for right pelvic fracture after fall. Patient presented from assisted living facility.  Patient was then admitted hospital for further evaluation and treatment. ? ?Assessment and Plan: ? ?* Closed fracture of multiple pubic rami, right, initial encounter ([place]) ?Frequent falls ?Orthopedics was consulted.  Plan is  nonoperative treatment at this time.  Orthopedic recommended weightbearing as tolerated.    Patient is currently at assisted living facility and PT OT recommended skilled nursing facility placement at this time. ? ?Alzheimer's disease with late onset ([place]) ?Significant memory deficits.  High risk for delirium.  Continue donepezil and Namenda. ?  ?Hypercholesterolemia ?Continue atorvastatin. ?  ?Essential hypertension ?Not on  antihypertensives. ?  ?History of TIA (transient ischemic attack) ?Continue atorvastatin and Plavix. ?  ?Adjustment disorder with depressed mood ?Continue home Lexapro and Depakote. ? ?Hypokalemia.  Mild.  Improved after replacement.  Potassium prior to discharge was 4.6. ? ?Hospice care patient. ? ? ?Consultants: Orthopedics ? ?Procedures performed: None ? ?Disposition: Skilled nursing facility, communicated with the patient's son at bedside. ? ?Diet recommendation:  ?Discharge Diet Orders (From admission, onward)  ? ?  Start     Ordered  ? 11/29/21 0000  Diet general       ? 11/29/21 1138  ? ?  ?  ? ?  ? ?Regular diet ?DISCHARGE MEDICATION: ?Allergies as of 11/29/2021   ?No Known Allergies ?  ? ?  ?Medication List  ?  ? ?TAKE these medications   ? ?alendronate 70 MG tablet ?Commonly known as: FOSAMAX ?Take 70 mg by mouth every Friday. ?  ?atorvastatin 10 MG tablet ?Commonly known as: LIPITOR ?TAKE 1 TABLET BY MOUTH  DAILY ?  ?calcium-vitamin D 500-200 MG-UNIT tablet ?Commonly known as: OSCAL WITH D ?Take 1 tablet by mouth every evening. With dinner ?  ?Centrum Adults Tabs ?Take 1 tablet by mouth daily. ?  ?clopidogrel 75 MG tablet ?Commonly known as: PLAVIX ?TAKE 1 TABLET BY MOUTH  DAILY ?  ?divalproex 125 MG capsule ?Commonly known as: DEPAKOTE SPRINKLE ?Take 125 mg by mouth 3 (three) times daily. ?  ?donepezil 10 MG tablet ?Commonly known as: ARICEPT ?Take 10 mg by mouth daily. ?  ?escitalopram 10 MG tablet ?Commonly known as: LEXAPRO ?Take 10 mg by mouth daily. ?  ?HYDROcodone-acetaminophen 5-325 MG tablet ?Commonly known as: Norco ?Take 1 tablet by mouth every 4 (four) hours as needed (pain). ?  ?melatonin  5 MG Tabs ?Take 5 mg by mouth at bedtime. ?  ?memantine 10 MG tablet ?Commonly known as: NAMENDA ?Take 10 mg by mouth 2 (two) times daily. ?  ?potassium chloride SA 20 MEQ tablet ?Commonly known as: KLOR-CON M ?Take 1 tablet (20 mEq total) by mouth daily. ?  ?senna-docusate 8.6-50 MG tablet ?Commonly known  as: Senokot-S ?Take 1 tablet by mouth at bedtime as needed for mild constipation or moderate constipation. ?  ?vitamin B-12 1000 MCG tablet ?Commonly known as: CYANOCOBALAMIN ?Take 1,000 mcg by mouth in the morning and at bedtime. ?  ?Vitamin D 125 MCG (5000 UT) Caps ?Take 1 capsule by mouth daily. ?What changed: how much to take ?  ? ?  ? ? Follow-up Information   ? ? Nicholes Stairs, MD Follow up in 2 week(s).   ?Specialty: Orthopedic Surgery ?Why: As needed ?Contact information: ?[address] ?[address] ?[place] [address] ?[phone] ? ? ?  ?  ? ?  ?  ? ?  ? ?Subjective ?Today patient was seen and examined at bedside.  Patient denies overt pain, nausea, fever, chills or rigor.  Patient's son at bedside. ? ?Discharge Exam: ?Filed Weights  ? 11/27/21 1214  ?Weight: 64.5 kg  ? ? ?  11/29/2021  ?  7:38 AM 11/29/2021  ?  5:26 AM 11/28/2021  ?  7:49 PM  ?Vitals with BMI  ?Systolic [phone]  ?Diastolic 59 64 61  ?Pulse 60 61 80  ?  ?General:  Average built, not in obvious distress ?HENT:   No scleral pallor or icterus noted. Oral mucosa is moist.  ?Chest:  Clear breath sounds.  Diminished breath sounds bilaterally. No crackles or wheezes.  ?CVS: S1 &S2 heard. No murmur.  Regular rate and rhythm. ?Abdomen: Soft, nontender, nondistended.  Bowel sounds are heard.   ?Extremities: No cyanosis, clubbing or edema.  Peripheral pulses are palpable. ?Psych: Alert, awake and  oriented to self and place. Communicative, underlying dementia ?CNS:  No cranial nerve deficits.  Moves extremities. ?Skin: Warm and dry.  No rashes noted. ? ? ?Condition at discharge: good ? ?The results of significant diagnostics from this hospitalization (including imaging, microbiology, ancillary and laboratory) are listed below for reference.  ? ?Imaging Studies: ?DG Ribs Bilateral W/Chest ? ?Result Date: 11/01/2021 ?CLINICAL DATA:  Fall.  Bilateral hip and rib pain. EXAM: BILATERAL RIBS AND CHEST - 4+ VIEW COMPARISON:  01/29/2022  FINDINGS: Heart size is normal. Dual lead pacemaker in place. Aortic atherosclerosis is present. The lungs are clear. No pneumothorax or hemothorax. Rib films do not show a fracture on either side. IMPRESSION: No active cardiopulmonary disease.  No visible rib fracture. Electronically Signed   By: Nelson Chimes M.D.   On: 11/01/2021 18:50  ? ?DG Pelvis 1-2 Views ? ?Result Date: 11/25/2021 ?CLINICAL DATA:  Status post fall. EXAM: PELVIS - 1-2 VIEW COMPARISON:  November 01, 2021 FINDINGS: Ill-defined fracture deformities are seen involving the right superior and right inferior pubic rami. This is a new finding when compared to the prior study. There is no evidence of dislocation. No pelvic bone lesions are seen. IMPRESSION: Nondisplaced fractures of the right superior and right inferior pubic rami. CT correlation is recommended. Electronically Signed   By: Virgina Norfolk M.D.   On: 11/25/2021 19:51  ? ?DG Pelvis 1-2 Views ? ?Result Date: 11/01/2021 ?CLINICAL DATA:  [place].  Hip and pelvic pain. EXAM: PELVIS - 1-2 VIEW COMPARISON:  None. FINDINGS: There is no evidence  of pelvic fracture or diastasis. No pelvic bone lesions are seen. IMPRESSION: Negative. Electronically Signed   By: Nelson Chimes M.D.   On: 11/01/2021 18:50  ? ?CT Head Wo Contrast ? ?Result Date: 11/25/2021 ?CLINICAL DATA:  Mental status change, unknown cause EXAM: CT HEAD WITHOUT CONTRAST TECHNIQUE: Contiguous axial images were obtained from the base of the skull through the vertex without intravenous contrast. RADIATION DOSE REDUCTION: This exam was performed according to the departmental dose-optimization program which includes automated exposure control, adjustment of the mA and/or kV according to patient size and/or use of iterative reconstruction technique. COMPARISON:  11/01/2021 FINDINGS: Brain: Confluent hypodensities are again seen throughout the periventricular and subcortical white matter, consistent with chronic small vessel ischemic change. No  evidence of acute infarct or hemorrhage. Lateral ventricles and midline structures are stable. No acute extra-axial fluid collections. No mass effect. Vascular: Stable atherosclerosis.  No hyperdense vessel. Skull: Norm

## 2021-11-29 NOTE — TOC Progression Note (Addendum)
Transition of Care (TOC) - Progression Note  ? ? ?Patient Details  ?Name: Sara Perkins ?MRN: [id number] ?Date of Birth: 1936/08/01 ? ?Transition of Care (TOC) CM/SW Contact  ?Joanne Chars, LCSW ?Phone Number: ?11/29/2021, 8:44 AM ? ?Clinical Narrative:   level 2 passr docs uploaded in [place] [facility]. ? ?1010: passr approved: [id number] E ? ? ?Expected Discharge Plan: [place] ?Barriers to Discharge: Continued Medical Work up, SNF Pending bed offer ? ?Expected Discharge Plan and Services ?Expected Discharge Plan: [place] ?In-house Referral: Clinical Social Work ?  ?Post Acute Care Choice: [place] ?Living arrangements for the past 2 months: [place] ([facility]) ?                ?  ?  ?  ?  ?  ?  ?  ?  ?  ?  ? ? ?Social Determinants of Health (SDOH) Interventions ?  ? ?Readmission Risk Interventions ? ?  11/02/2021  ?  2:37 PM  ?Readmission Risk Prevention Plan  ?Transportation Screening Complete  ?Home Care Screening Complete  ?Medication Review (RN CM) Complete  ? ? ?

## 2021-11-29 NOTE — Progress Notes (Signed)
Patient transported to SNF via [place]. Going to [facility]. Report called to nurse at [facility]. OOF DNR signed. IV removed. Denies pain. No concerns at discharge.  ?

## 2021-11-29 NOTE — TOC Transition Note (Signed)
Transition of Care (TOC) - CM/SW Discharge Note ? ? ?Patient Details  ?Name: Sara Perkins ?MRN: [id number] ?Date of Birth: 1935-11-09 ? ?Transition of Care [facility]) CM/SW Contact:  ?Joanne Chars, LCSW ?Phone Number: ?11/29/2021, 12:16 PM ? ? ?Clinical Narrative:    ?Pt discharging to [facility].  RN call [phone] for report.  ? ? ?Final next level of care: [place] ?Barriers to Discharge: Barriers Resolved ? ? ?Patient Goals and CMS Choice ?  ?  ?Choice offered to / list presented to : Adult Children (son Sherren Mocha over the phone) ? ?Discharge Placement ?  ?           ?[facility]: [facility] ?Patient to be transferred to facility by: [facility] ?Name of family member notified: son Sherren Mocha ?Patient and family notified of of transfer: 11/29/21 ? ?Discharge Plan and Services ?In-house Referral: Clinical Social Work ?  ?Post Acute Care Choice: [place]          ?  ?  ?  ?  ?  ?  ?  ?  ?  ?  ? ?Social Determinants of Health (SDOH) Interventions ?  ? ? ?Readmission Risk Interventions ? ?  11/02/2021  ?  2:37 PM  ?Readmission Risk Prevention Plan  ?Transportation Screening Complete  ?Home Care Screening Complete  ?Medication Review (RN CM) Complete  ? ? ? ? ? ?

## 2021-11-29 NOTE — TOC Progression Note (Addendum)
Transition of Care (TOC) - Progression Note  ? ? ?Patient Details  ?Name: Sara Perkins ?MRN: [id number] ?Date of Birth: Oct 08, 1935 ? ?Transition of Care (TOC) CM/SW Contact  ?Joanne Chars, LCSW ?Phone Number: ?11/29/2021, 10:06 AM ? ?Clinical Narrative:   CSW provided bed offers to son.  He will review and make decision.  Informed him of potential for DC today.  ? ?1120: TC from [place], he requested to speak with [employer], CSW reached out and she will call him.  Call back from [place] and he does want to accept offer at [facility].  CSW took phone to pt room and Sherren Mocha spoke with his mother about plan for SNF today and she is agreeable at this point.  ? ?Expected Discharge Plan: [place] ?Barriers to Discharge: Continued Medical Work up, SNF Pending bed offer ? ?Expected Discharge Plan and Services ?Expected Discharge Plan: [place] ?In-house Referral: Clinical Social Work ?  ?Post Acute Care Choice: [place] ?Living arrangements for the past 2 months: [place] ([facility]) ?                ?  ?  ?  ?  ?  ?  ?  ?  ?  ?  ? ? ?Social Determinants of Health (SDOH) Interventions ?  ? ?Readmission Risk Interventions ? ?  11/02/2021  ?  2:37 PM  ?Readmission Risk Prevention Plan  ?Transportation Screening Complete  ?Home Care Screening Complete  ?Medication Review (RN CM) Complete  ? ? ?

## 2021-11-29 NOTE — Plan of Care (Signed)

## 2021-12-05 ENCOUNTER — Non-Acute Institutional Stay: Payer: Medicare Other | Admitting: Internal Medicine

## 2021-12-05 ENCOUNTER — Encounter: Payer: Self-pay | Admitting: Internal Medicine

## 2021-12-05 VITALS — BP 123/69 | HR 81 | Temp 97.1°F | Resp 19 | Wt 141.8 lb

## 2021-12-05 DIAGNOSIS — S32591A Other specified fracture of right pubis, initial encounter for closed fracture: Secondary | ICD-10-CM

## 2021-12-05 DIAGNOSIS — Z515 Encounter for palliative care: Secondary | ICD-10-CM

## 2021-12-05 DIAGNOSIS — R296 Repeated falls: Secondary | ICD-10-CM

## 2021-12-05 DIAGNOSIS — G301 Alzheimer's disease with late onset: Secondary | ICD-10-CM

## 2021-12-05 DIAGNOSIS — F5101 Primary insomnia: Secondary | ICD-10-CM

## 2021-12-05 MED ORDER — ACETAMINOPHEN 500 MG PO TABS: 500.0000 mg | ORAL_TABLET | Freq: Three times a day (TID) | ORAL | 0 refills | Status: AC

## 2021-12-05 NOTE — Progress Notes (Signed)
[facility] Palliative Care Consult Note Telephone: [phone]  Fax: [phone]   Date of encounter: 12/05/21 12:27 PM PATIENT NAME: Sara Perkins [address] [place] [address]   [phone] (home)  DOB: [birth year] MRN: [id number] PRIMARY CARE PROVIDER:    Lajean Manes, MD,  Sara [facility]. [employer] [address] [place] [address] [phone]  REFERRING PROVIDER:   Madison Hickman, MD--[facility]:    Contact Information     Name Relation Home Work Mobile   Perkins,Sara Son   [phone]   Sara Perkins, [place]   [phone]      I met face to face with patient and family in [facility] facility. Palliative Care was asked to follow this patient by consultation request of Sara Perkins to address advance care planning and complex medical decision making. This is the initial visit.                                     ASSESSMENT AND PLAN / RECOMMENDATIONS:   Advance Care Planning/Goals of Care: Goals include to maximize quality of life and symptom management. Patient/health care surrogate gave his/her permission to discuss.Our advance care planning conversation included a discussion about:    The value and importance of advance care planning  Experiences with loved ones who have been seriously ill or have died  Exploration of personal, cultural or spiritual beliefs that might influence medical decisions  Exploration of goals of care in the event of a sudden injury or illness  Identification  of a healthcare agent  Review and updating or creation of an  advance directive document . Decision not to resuscitate or to de-escalate disease focused treatments due to poor prognosis. CODE STATUS:  DNR as confirmed with her son, Sara Perkins, I introduced palliative care, differentiated it from hospice care, reviewed pt's goals at this time--to continue rehab with goal to return to memory care ALF [facility] at  Sara Perkins  Symptom Management/Plan: 1. Closed fracture of multiple pubic rami, right, initial encounter (Sara Perkins) -add scheduled tylenol and parameters to norco not to exceed 3g per day of acetaminophen -hopefully, this will take the edge off of her pain so that she's able to better participate in therapy which sounds like it's been a concern given she does not remember her injury -cont PT, OT with goal to return to [facility]  2. Alzheimer's disease with late onset (Sara Perkins) -moderate to severe -continues on aricept and namenda as well as depakote sprinkles for mood stabilization--she does have behaviors and will strike staff at times   3. Frequent falls -seems these may have been related to her bradycardia at one time, but this was mediated with her pacemaker; ? Any orthostatic hypotension now as pt reports dizziness so may need to ask primary team or therapy to check on this during daily activity  4. Primary insomnia -long-term issue, continue melatonin at hs; also on antidepressant for adjustment plus mood stabilizer which is likely to cause drowsiness  5. Palliative care by specialist -spoke with Sara Perkins and introduced MOST form--he and his brother will discuss and reach out to me when they are ready to complete the document for their mom -she already has a DNR  Follow up Palliative Care Visit: Palliative care will continue to follow for complex medical decision making, advance care planning, and clarification of goals. Return approximately 4 weeks  or prn.  This visit was coded based on medical decision making (MDM). 17 mins on ACP.  PPS: 40%  HOSPICE ELIGIBILITY/DIAGNOSIS: Not at this time/AD  Chief Complaint:  initial palliative consult  HISTORY OF PRESENT ILLNESS:  Sara Perkins is a 86 y.o. year old female  with Alzheimer's disease with behavioral disturbance (previously staying at memory care at [facility]), frequent falls, adjustment disorder with depressed  mood, h/o TIA, htn, hyperlipidemia, symptomatic bradycardia s/p pacemaker, and 4 recent trips to the hospital this year:  1/26  ED for fall with scalp hematoma, 3/13 ED for multiple falls, but no major injuries, then admission 3/23 to 24 with NSTEMI, and most recently admission from 4/16-20 with fall with right pubic ramus fracture.  She is to f/u with Dr. Stann Perkins in 2-3 weeks but is here for PT.  She reports being up with her walker with PT.  She admits to pain in her right groin when I asked her to lift her right leg.  No pain reported at rest.  Reportedly, bowels are moving.  She is incontinent of bowel and bladder currently due to her functional mobility challenges s/p fx.  She reported it was 29 but did know that she lives in [place], [place].  She says she goes out with her husband and other couples for dinner sometimes and that they have two sons who live locally.    She talks about recurrent falls and syncopal episodes and says she does get dizzy on standing sometimes.    She does recognize Sara Perkins.  She gets confused about where he lives.    Sara Perkins does have meeting with PT tomorrow.    History obtained from review of EMR, discussion with primary team, and interview with family, facility staff/caregiver and/or Sara Perkins.   I reviewed available labs, medications, imaging, studies and related documents from the EMR.  Records reviewed and summarized above.   ROS General: NAD EYES: denies vision changes ENMT: denies dysphagia Cardiovascular: denies chest pain, denies DOE Pulmonary: denies cough, denies increased SOB Abdomen: endorses good appetite--had eaten well at lunch but had lots of food particles all over table, bed, self, denies constipation, endorses incontinence of bowel GU: denies dysuria, endorses incontinence of urine MSK:  has increased weakness,  right groin pain when moves legs, no falls reported here but fall took her to hospital and has had frequently with hypotension and prior  syncope Skin: denies rashes or wounds Neurological: reports right groin pain, denies insomnia Psych: seems depressed Heme/lymph/immuno: denies bruises, abnormal bleeding  Physical Exam: Current and past weights:  141 lbs on 11/30/21 here and 142.2 on 4/18, but actually trended up per flowsheets in epic Constitutional: NAD General: frail appearing, thin EYES: anicteric sclera, lids intact, no discharge  ENMT: intact hearing, oral mucous membranes moist, dentition intact CV: S1S2, RRR, no LE edema Pulmonary: LCTA, no increased work of breathing, no cough, room air Abdomen: intake 75-100%, normo-active BS + 4 quadrants, soft and non tender, no ascites GU: deferred MSK:  sarcopenia, moves all extremities, ambulatory with walker with PT, but not participating well--agitated and combative at times Skin: warm and dry, no rashes or wounds on visible skin Neuro:  generalized weakness,  cognitive impairment Psych: non-anxious affect, A and O to self, says 1937 but knew [facility] Hem/lymph/immuno: no widespread bruising  CURRENT PROBLEM LIST:  Patient Active Problem List   Diagnosis Date Noted   Closed fracture of multiple pubic rami, right, initial encounter ([place]) 11/25/2021  Alzheimer's disease with late onset ([place]) 11/02/2021   Chronic pain 11/02/2021   Essential hypertension 11/02/2021   Hypercholesterolemia 11/02/2021   Primary insomnia 11/02/2021   NSTEMI (non-ST elevated myocardial infarction) ([place]) 05/22/2021   Frequent falls 05/22/2021   Pacemaker 04/27/2018   Memory change 01/28/2018   Adjustment disorder with depressed mood 01/28/2018   History of TIA (transient ischemic attack) 01/28/2018   PAST MEDICAL HISTORY:  Active Ambulatory Problems    Diagnosis Date Noted   Memory change 01/28/2018   Adjustment disorder with depressed mood 01/28/2018   History of TIA (transient ischemic attack) 01/28/2018   Pacemaker 04/27/2018   NSTEMI (non-ST elevated myocardial infarction)  ([place]) 05/22/2021   Frequent falls 05/22/2021   Alzheimer's disease with late onset ([place]) 11/02/2021   Chronic pain 11/02/2021   Essential hypertension 11/02/2021   Hypercholesterolemia 11/02/2021   Primary insomnia 11/02/2021   Closed fracture of multiple pubic rami, right, initial encounter ([place]) 11/25/2021   Resolved Ambulatory Problems    Diagnosis Date Noted   Hypokalemia 11/28/2021   Past Medical History:  Diagnosis Date   Alzheimer disease ([facility])    Broken foot 2017   Chicken pox    Syncope    TIA (transient ischemic attack)    UTI (urinary tract infection)    SOCIAL HX:  Social History   Tobacco Use   Smoking status: Never    Passive exposure: Never   Smokeless tobacco: Never  Substance Use Topics   Alcohol use: Never   FAMILY HX:  Family History  Problem Relation Age of Onset   Kidney disease Father       ALLERGIES: No Known Allergies   PERTINENT MEDICATIONS:  MED ALENDRONATE SODIUM 70 MG TAB TAKE 1 TABLET BY MOUTH ON FRIDAY *ON AN EMPTY STOMACH. WAIT AT LEAST 30 MINUTES (PREFERABLY 1 TO 2 HOURS) AFTER TAKING THIS MEDICATION DeMarchi, Gwyndolyn Saxon 3/81/01, 7:51 AM MED CERTAVITE SENIOR TABLET TAKE 1 TABLET BY MOUTH ONCE DAILY DeMarchi, William 12/01/21, 9:00 AM MED ATORVASTATIN 10 MG TABLET TAKE 1 TABLET BY MOUTH ONCE DAILY DeMarchi, William 12/01/21, 9:00 AM MED CLOPIDOGREL 75 MG TABLET TAKE 1 TABLET BY MOUTH ONCE DAILY DeMarchi, William 12/01/21, 9:00 AM MED DONEPEZIL HCL 10 MG TABLET TAKE 1 TABLET BY MOUTH ONCE DAILY DeMarchi, William 12/01/21, 9:00 AM MED ESCITALOPRAM 10 MG TABLET TAKE 1 TABLET BY MOUTH ONCE DAILY DeMarchi, William 12/01/21, 9:00 AM MED POTASSIUM CL ER 20 MEQ TABLET TAKE 1 TABLET BY MOUTH ONCE DAILY (DO NOT CRUSH) DeMarchi, William 12/01/21, 9:00 AM MED VITAMIN D3 125 MCG CAPSULE TAKE 1 CAPSULE BY MOUTH ONCE DAILY (DO NOT CRUSH) DeMarchi, William 12/01/21, 9:00 AM MED MELATONIN 5 MG TABLET TAKE 1 TABLET BY MOUTH AT BEDTIME FOR  SLEEP AID DeMarchi, William 11/30/21, 9:00 PM MED MEMANTINE HCL 10 MG TABLET TAKE 1 TABLET BY MOUTH TWICE A DAY DeMarchi, William 11/30/21, 9:00 PM MED VITAMIN B-12 1,000 MCG TABLET TAKE 1 TABLET BY MOUTH EVERY MORNING AND AT BEDTIME DeMarchi, William 11/30/21, 9:00 PM MED OYSTER SHELL 500-VIT D3 200 TB TAKE 1 TABLET BY MOUTH EVERY EVENING WITH DINNER DeMarchi, William 11/30/21, 6:00 PM MED DIVALPROEX DR 125 MG CAP SPRNK TAKE 1 CAPSULE BY MOUTH THREE TIMES A DAY **DO NOT CRUSH* DeMarchi, William 11/30/21, 1:00 PM MED SENNA-DOCUSATE SODIUM 8.6 -50 MG TABLET:Give 1 tablet by mouth at bedtime as needed for mild or moderate constipstion DeMarchi, William 11/29/21, 1:58 PM MED HYDROCODONE-ACETAMIN 5-325 WC:HENI 1 tablet by mouth every 4 hours as needed for pain x  7 days [facility] 11/29/21, 1:26 PM MED Constipation (3 of 4): If not relieved by Biscodyl suppository, give disposable Saline Enema rectally X 1 dose/24 hrs as needed (Do not use constipation standing orders for residents with renal failure/CFR less than 30. Contact MD for orders)(Physician Or Sara Perkins 11/29/21, 12:55 PM MED Constipation (1 of 4): If no BM in 3 days, give 30 cc Milk of Magnesium p.o. x 1 dose in 24 hours as needed (Do not use standing constipation orders for residents with renal failure CFR less than 30. Contact MD for orders) (Physician Order) Sara Perkins 11/29/21, 12:52 PM MED Constipation (2 of 4): If not relieved by MOM, give 10 mg Bisacodyl suppositiory rectally X 1 dose in 24 hours as needed (Do not use constipation standing orders for residents with renal failure/CFR less than 30. Contact MD for orders) (Physician Order) Sara Perkins 11/29/21, 12:52 PM  Thank you for the opportunity to participate in the care of Ms. Recine.  The palliative care team will continue to follow. Please call our office at [phone] if we can be of additional assistance.   Hollace Kinnier, DO    COVID-19 PATIENT SCREENING TOOL Asked and negative response unless otherwise noted:  Have you had symptoms of covid, tested positive or been in contact with someone with symptoms/positive test in the past 5-10 days? no

## 2021-12-06 NOTE — Progress Notes (Signed)
Remote pacemaker transmission.   

## 2022-02-19 ENCOUNTER — Ambulatory Visit (INDEPENDENT_AMBULATORY_CARE_PROVIDER_SITE_OTHER)

## 2022-02-19 DIAGNOSIS — I495 Sick sinus syndrome: Secondary | ICD-10-CM

## 2022-02-19 LAB — CUP PACEART REMOTE DEVICE CHECK
Battery Remaining Longevity: 54 mo
Battery Remaining Percentage: 85 %
Brady Statistic RA Percent Paced: 27 %
Brady Statistic RV Percent Paced: 0 %
Date Time Interrogation Session: 20230711023100
Implantable Lead Implant Date: 20100415
Implantable Lead Implant Date: 20100415
Implantable Lead Location: 753859
Implantable Lead Location: 753860
Implantable Lead Model: 5076
Implantable Lead Model: 5076
Implantable Pulse Generator Implant Date: 20180416
Lead Channel Impedance Value: 491 Ohm
Lead Channel Impedance Value: 495 Ohm
Lead Channel Pacing Threshold Amplitude: 0.5 V
Lead Channel Pacing Threshold Amplitude: 1 V
Lead Channel Pacing Threshold Pulse Width: 0.4 ms
Lead Channel Pacing Threshold Pulse Width: 0.4 ms
Lead Channel Setting Pacing Amplitude: 2 V
Lead Channel Setting Pacing Amplitude: 2.5 V
Lead Channel Setting Pacing Pulse Width: 0.4 ms
Lead Channel Setting Sensing Sensitivity: 2.5 mV
Pulse Gen Serial Number: 765776

## 2022-03-06 IMAGING — CT CT HEAD W/O CM
3 of 7 series · 13 of 47 positions shown, 15 images · non-contrast
Comparison: None.

CLINICAL DATA: Head trauma, mod-severe.  Fall.

EXAM:
CT HEAD WITHOUT CONTRAST
TECHNIQUE: Contiguous axial images were obtained from the base of the skull
through the vertex without intravenous contrast.

[Series 4: head 5.0 h30s · axial · 0.42mm/px · z∈[-152,-27]mm · 8 of 33 slices shown, 10 images]
[im 4/33  brain]
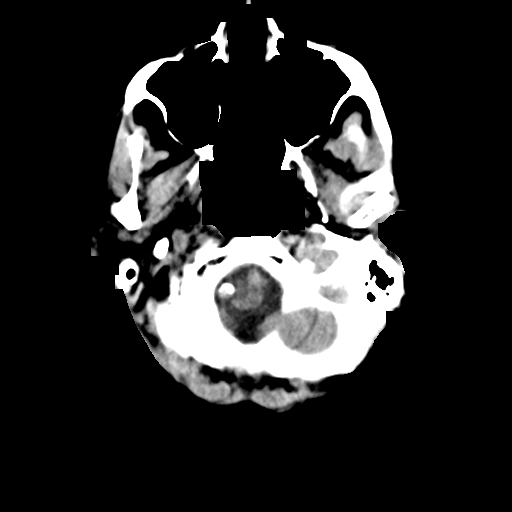
[im 4/33  bone]
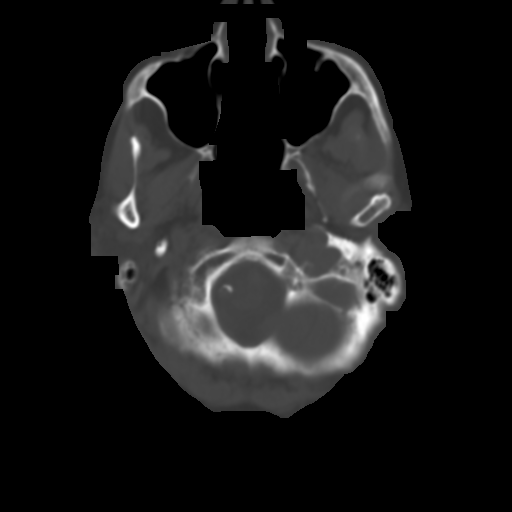
[im 8/33  brain]
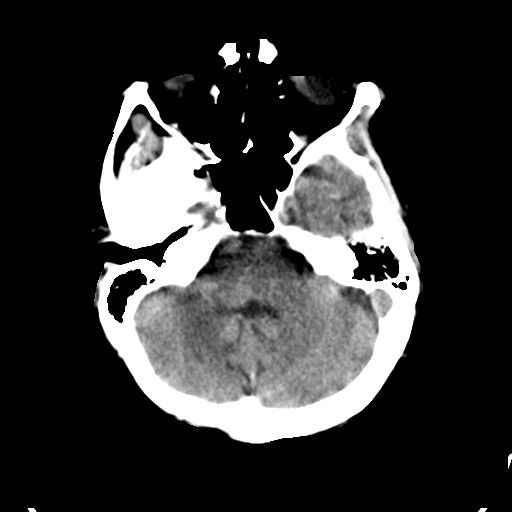
[im 11/33  brain]
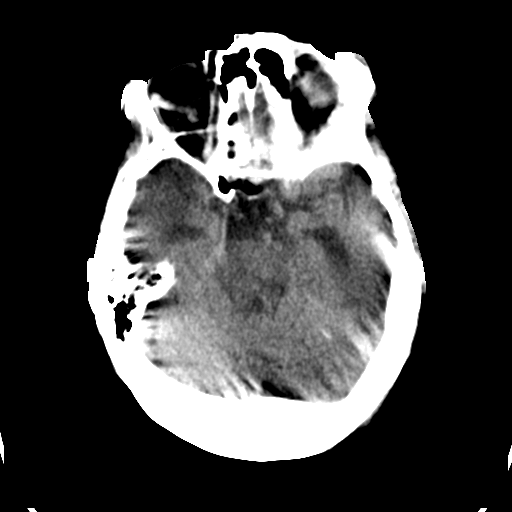
[im 15/33  brain]
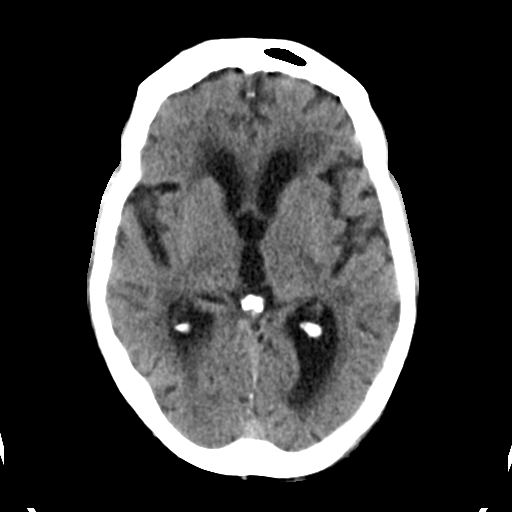
[im 18/33  brain]
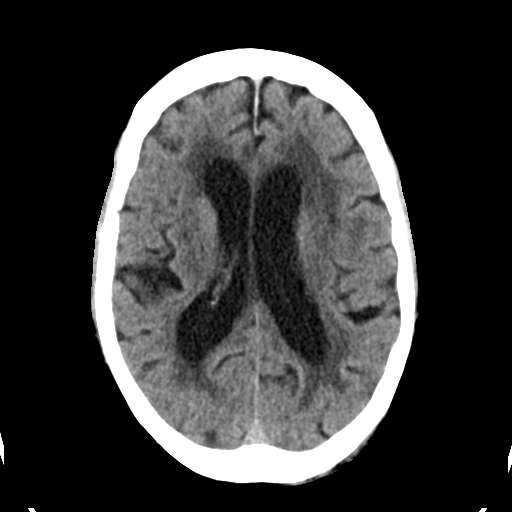
[im 18/33  bone]
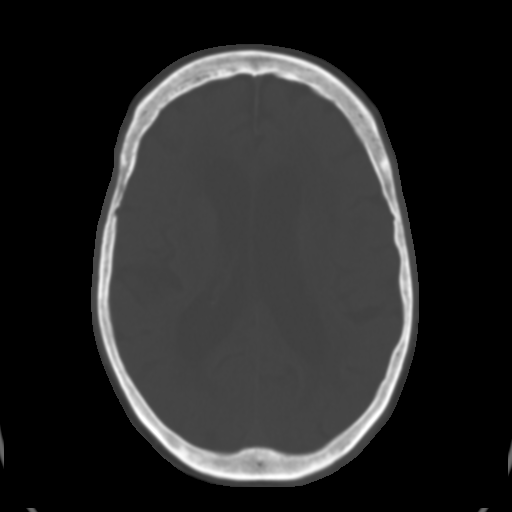
[im 22/33  brain]
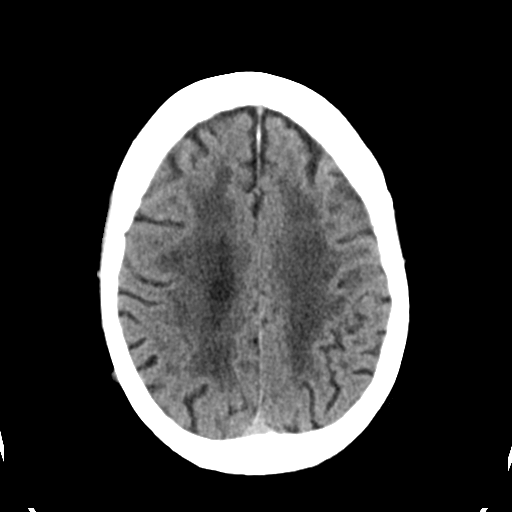
[im 25/33  brain]
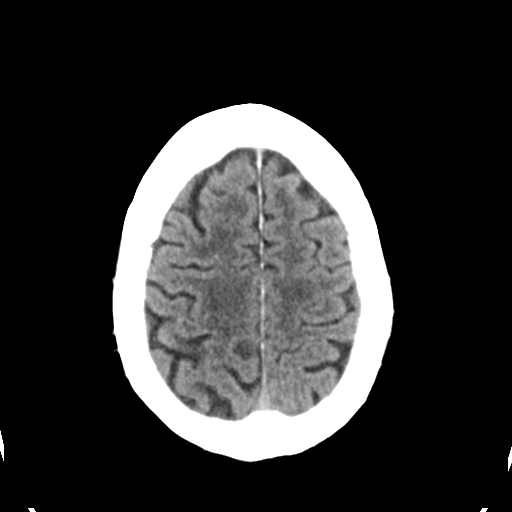
[im 29/33  brain]
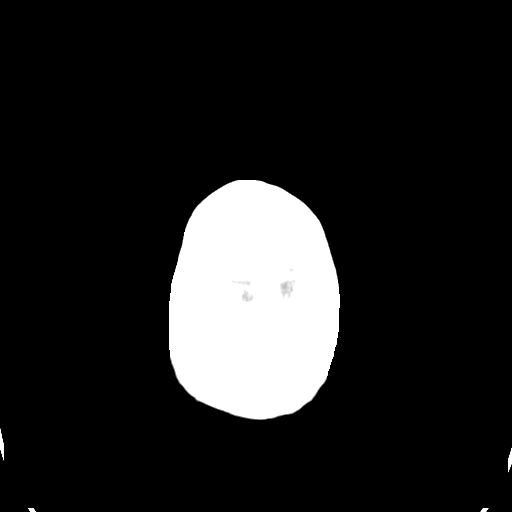

[Series 9: head 3.0 mpr cor · coronal · 0.21mm/px · 3 of 67 slices shown]
[im 11/67  brain]
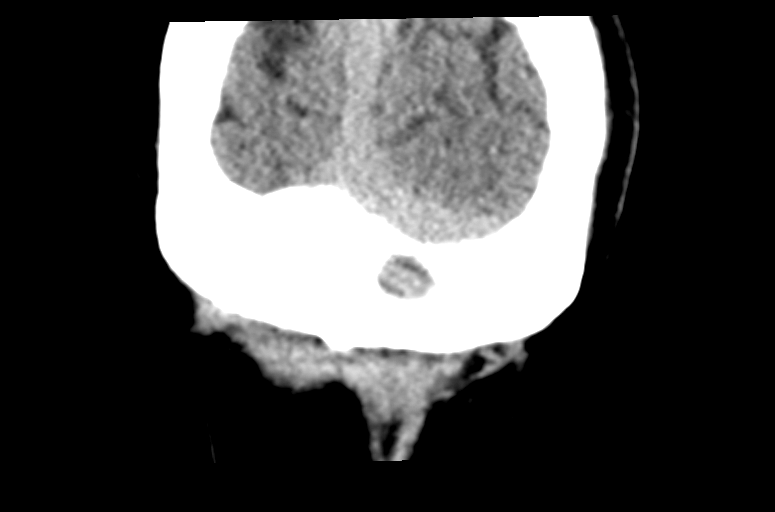
[im 25/67  brain]
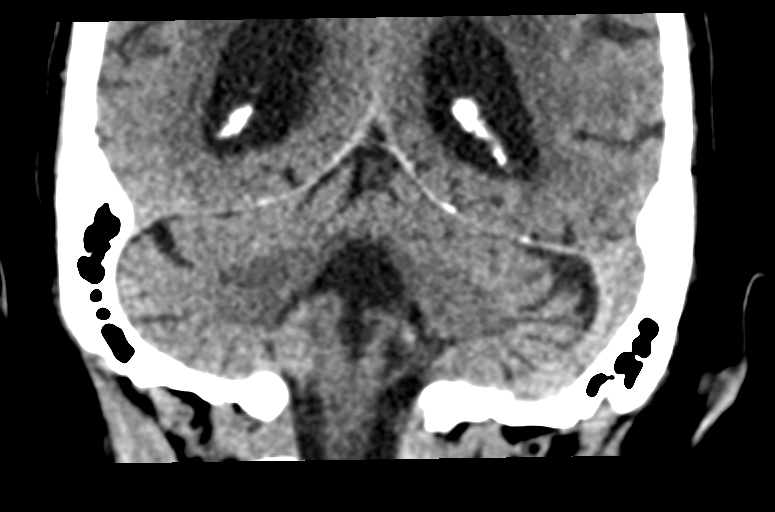
[im 39/67  brain]
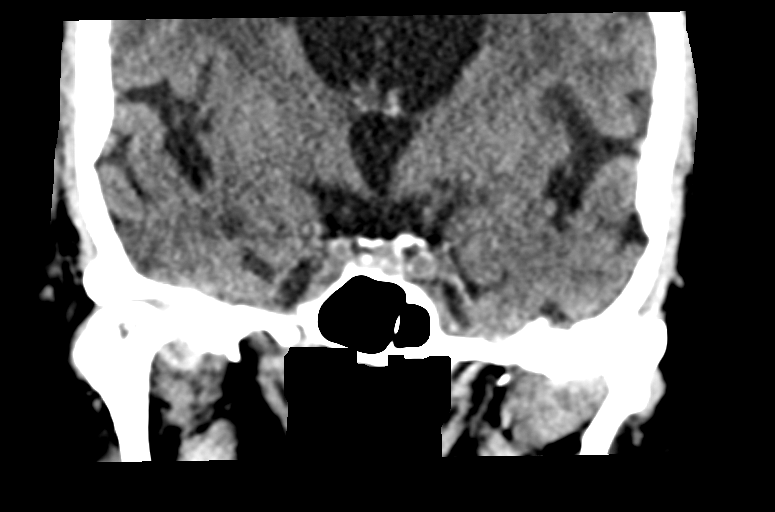

[Series 10: head 3.0 mpr sag · sagittal · 0.21mm/px · 2 of 59 slices shown]
[im 20/59  brain]
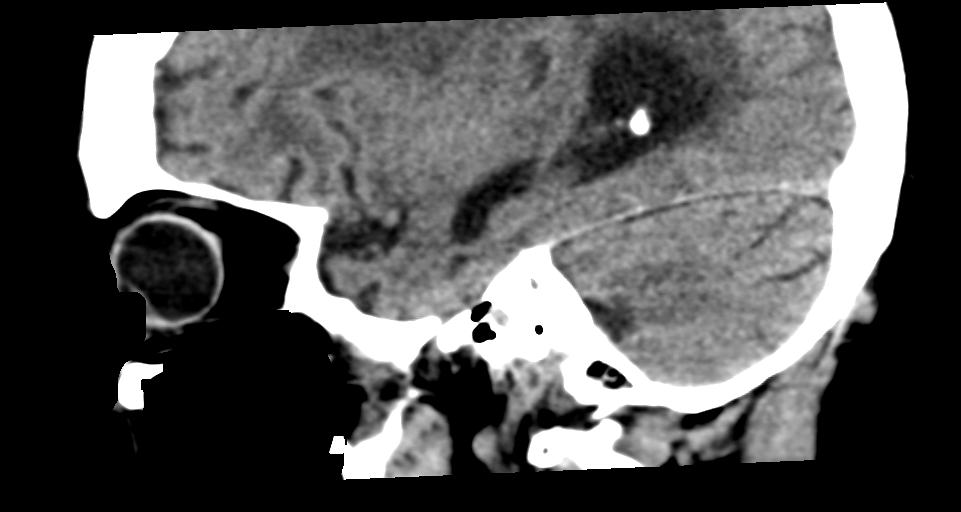
[im 39/59  brain]
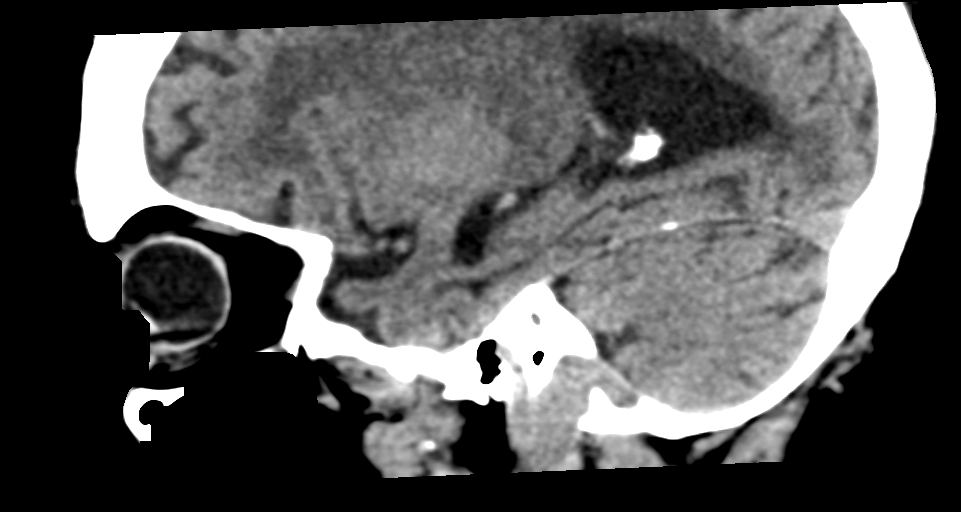

[13 of 47 positions shown; findings below may reference images not displayed]

FINDINGS: Brain: There is atrophy and chronic small vessel disease changes. No
acute intracranial abnormality. Specifically, no hemorrhage,
hydrocephalus, mass lesion, acute infarction, or significant
intracranial injury.

Vascular: No hyperdense vessel or unexpected calcification.

Skull: No acute calvarial abnormality.

Sinuses/Orbits: No acute findings

Other: None
IMPRESSION: Atrophy, chronic microvascular disease.

No acute intracranial abnormality.

## 2022-03-06 IMAGING — CT CT CERVICAL SPINE W/O CM
3 of 4 series · 13 of 33 positions shown, 16 images · non-contrast
Comparison: None.

CLINICAL DATA: Fall.  Neck trauma (Age >= 65y)

EXAM:
CT CERVICAL SPINE WITHOUT CONTRAST
TECHNIQUE: Multidetector CT imaging of the cervical spine was performed without
intravenous contrast. Multiplanar CT image reconstructions were also
generated.

[Series 5: c_spine 2.0 st · axial · 0.33mm/px · z∈[-288,-176]mm · 5 of 85 slices shown, 7 images]
[im 15/85  soft-tissue]
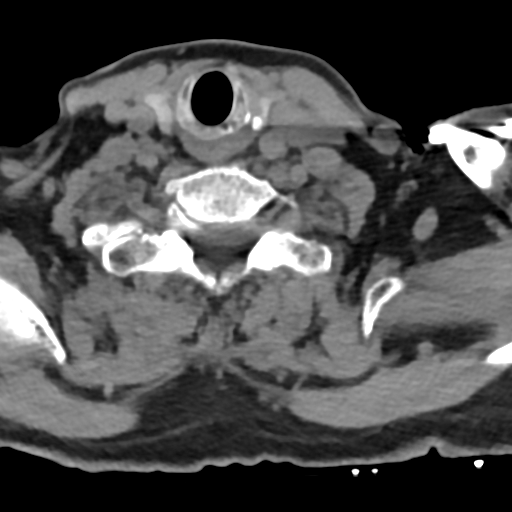
[im 15/85  bone]
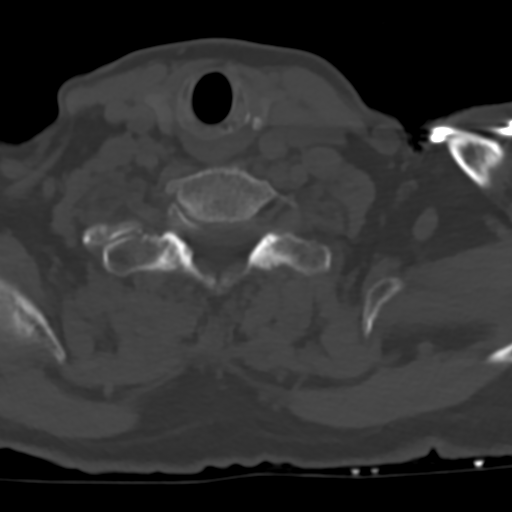
[im 29/85  bone]
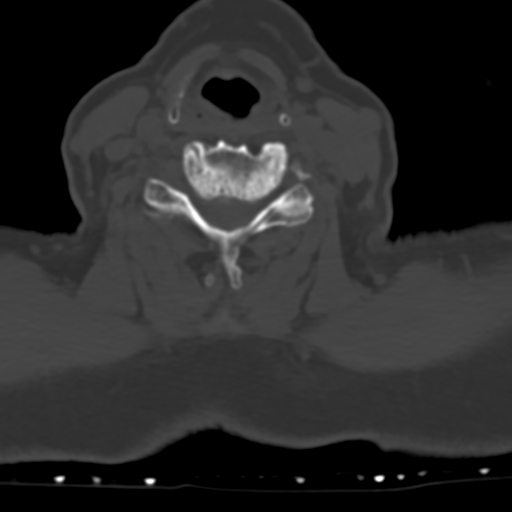
[im 43/85  bone]
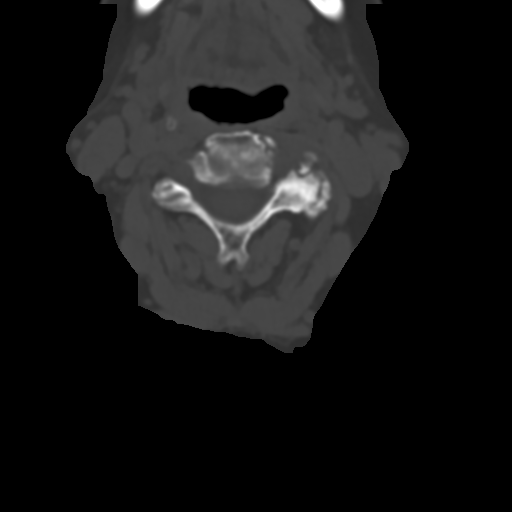
[im 57/85  bone]
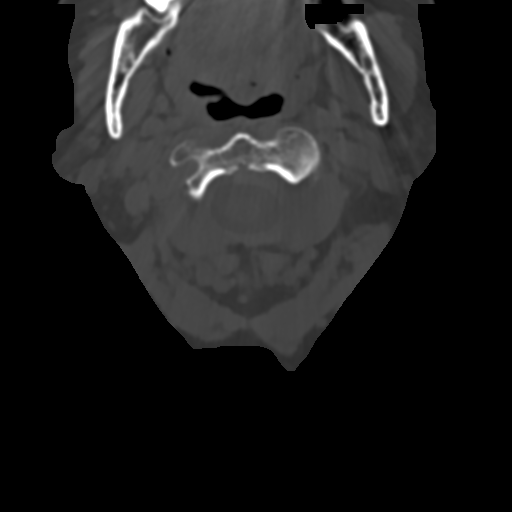
[im 71/85  soft-tissue]
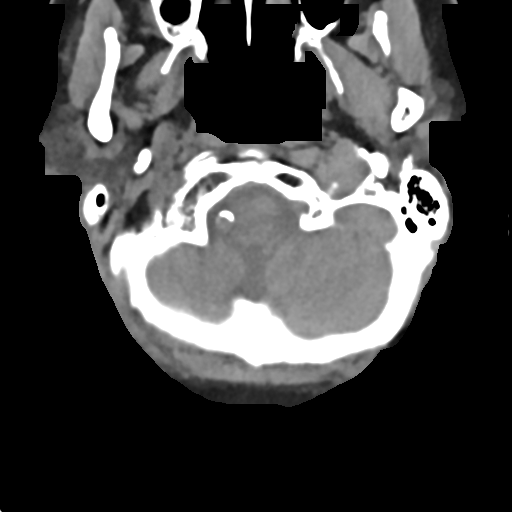
[im 71/85  bone]
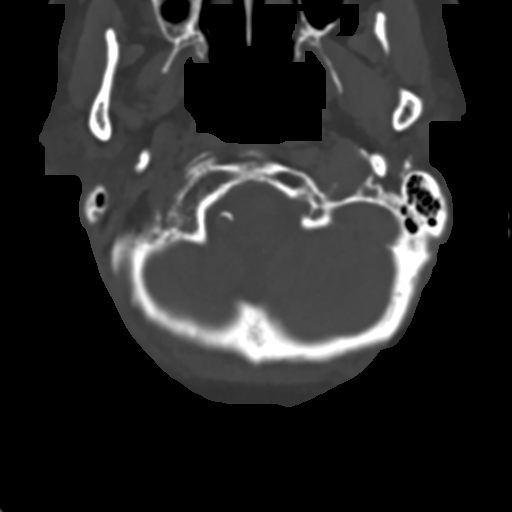

[Series 6: coronal bone · coronal · 0.25mm/px · 3 of 61 slices shown]
[im 13/61  bone]
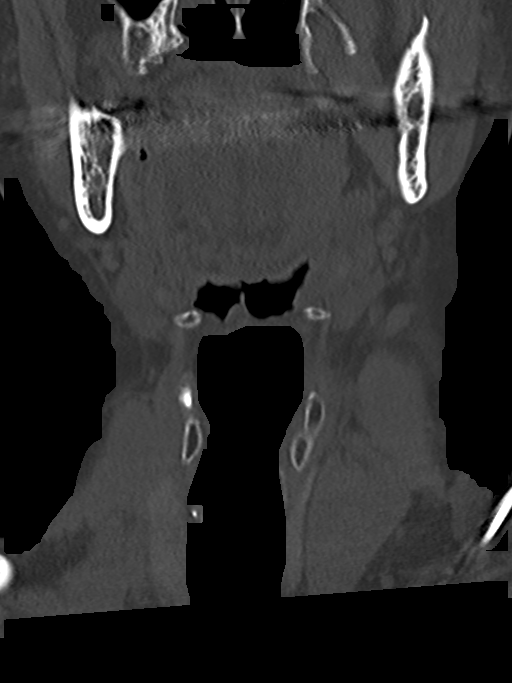
[im 25/61  bone]
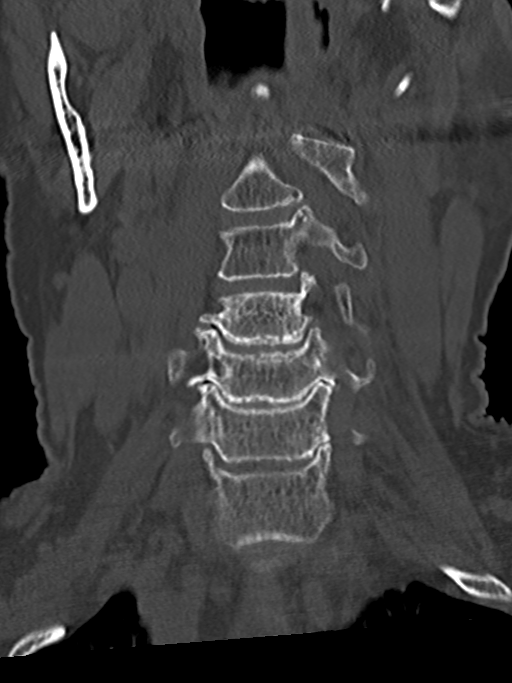
[im 37/61  bone]
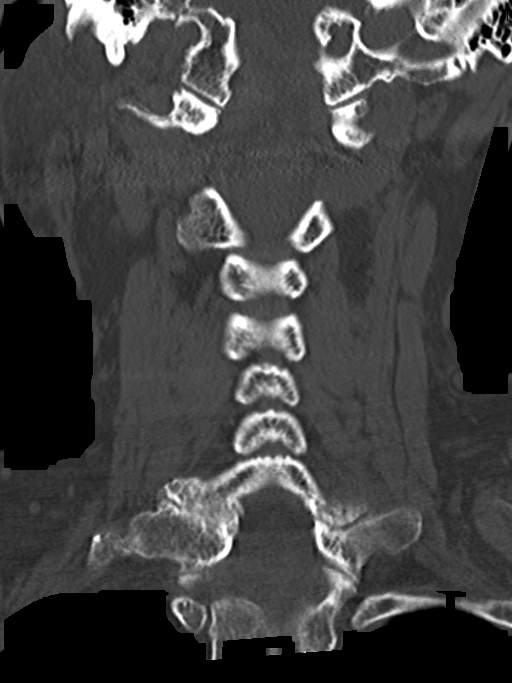

[Series 7: sagittal bone · sagittal · 0.25mm/px · 5 of 61 slices shown, 6 images]
[im 21/61  bone]
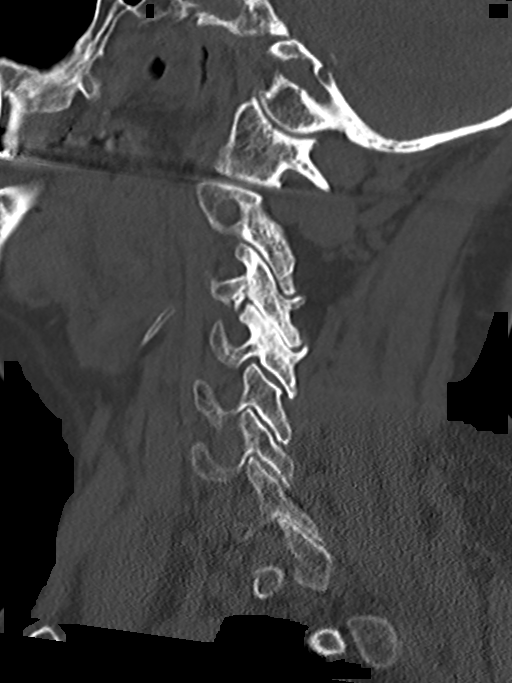
[im 26/61  bone]
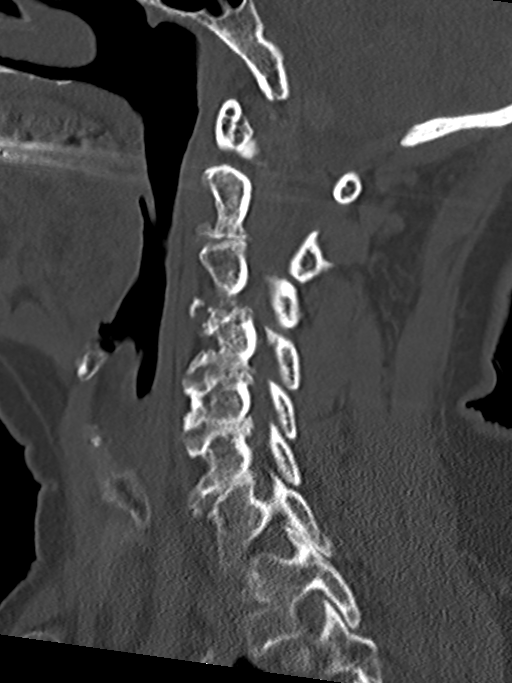
[im 31/61  soft-tissue]
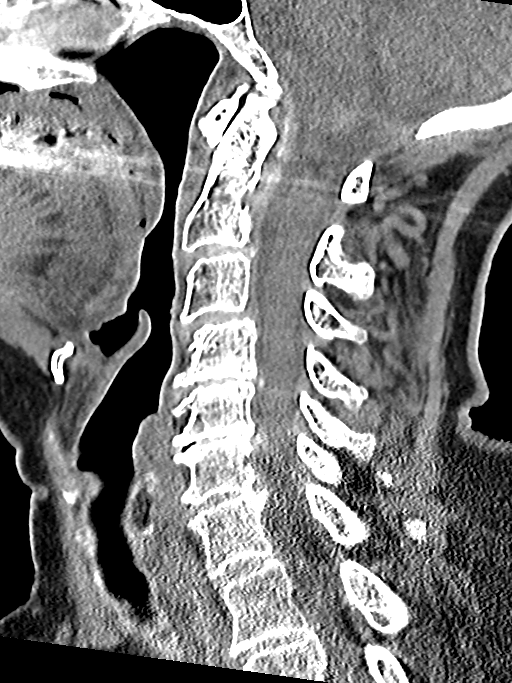
[im 31/61  bone]
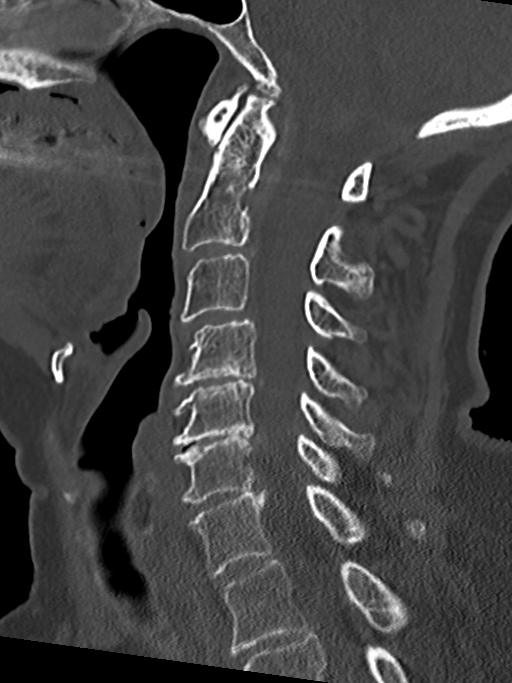
[im 36/61  bone]
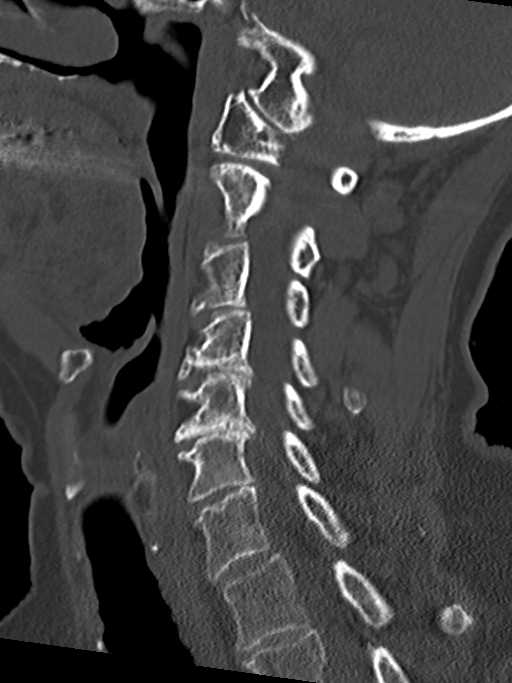
[im 41/61  bone]
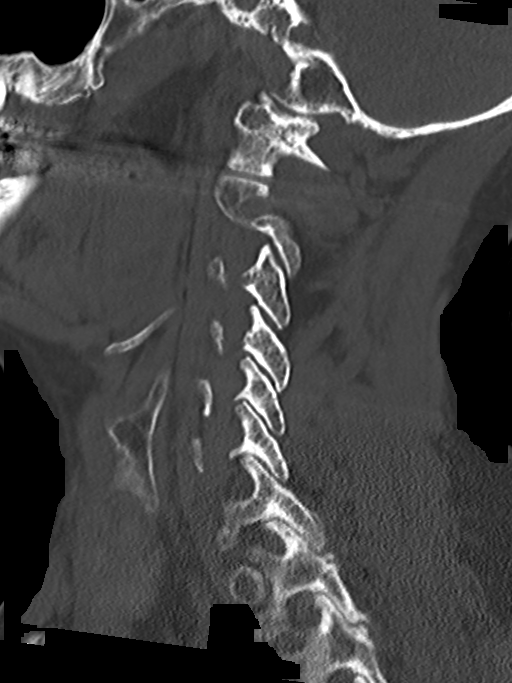

[13 of 33 positions shown; findings below may reference images not displayed]

FINDINGS: Alignment: Slight degenerative anterolisthesis of C3 on C4.

Skull base and vertebrae: No acute fracture. No primary bone lesion
or focal pathologic process.

Soft tissues and spinal canal: No prevertebral fluid or swelling. No
visible canal hematoma.

Disc levels: Diffuse degenerative disc disease and facet disease.
Disc disease is most pronounced from C4-5 through C6-7. Facet
disease is most pronounced on the left.

Upper chest: Biapical scarring.  No acute findings

Other: None
IMPRESSION: Degenerative disc and facet disease.  No acute bony abnormality.

## 2022-03-14 NOTE — Progress Notes (Signed)
Remote pacemaker transmission.   

## 2022-04-02 NOTE — Progress Notes (Signed)
Remote reviewed. Battery status noted.  Leads function stable

## 2022-05-21 ENCOUNTER — Ambulatory Visit (INDEPENDENT_AMBULATORY_CARE_PROVIDER_SITE_OTHER): Payer: Medicare Other

## 2022-05-21 DIAGNOSIS — I495 Sick sinus syndrome: Secondary | ICD-10-CM

## 2022-05-22 NOTE — Telephone Encounter (Signed)
Just FYI--Patient's son followed up regarding missed transmission. He advised he doesn't know how to send in a transmission and I informed him per previous note to contact [employer], offered to give him the phone number, but he declined and opted to disconnect the call.

## 2022-05-27 LAB — CUP PACEART REMOTE DEVICE CHECK
Battery Remaining Longevity: 48 mo
Battery Remaining Percentage: 75 %
Brady Statistic RA Percent Paced: 24 %
Brady Statistic RV Percent Paced: 0 %
Date Time Interrogation Session: 20231013143000
Implantable Lead Implant Date: 20100415
Implantable Lead Implant Date: 20100415
Implantable Lead Location: 753859
Implantable Lead Location: 753860
Implantable Lead Model: 5076
Implantable Lead Model: 5076
Implantable Pulse Generator Implant Date: 20180416
Lead Channel Impedance Value: 464 Ohm
Lead Channel Impedance Value: 474 Ohm
Lead Channel Pacing Threshold Amplitude: 0.4 V
Lead Channel Pacing Threshold Amplitude: 1.2 V
Lead Channel Pacing Threshold Pulse Width: 0.4 ms
Lead Channel Pacing Threshold Pulse Width: 0.4 ms
Lead Channel Setting Pacing Amplitude: 2 V
Lead Channel Setting Pacing Amplitude: 2.5 V
Lead Channel Setting Pacing Pulse Width: 0.4 ms
Lead Channel Setting Sensing Sensitivity: 2.5 mV
Pulse Gen Serial Number: 765776

## 2022-06-05 NOTE — Progress Notes (Signed)
Remote pacemaker transmission.   

## 2022-07-12 ENCOUNTER — Other Ambulatory Visit (HOSPITAL_COMMUNITY): Payer: Self-pay

## 2022-07-12 ENCOUNTER — Other Ambulatory Visit: Payer: Self-pay

## 2022-07-12 ENCOUNTER — Emergency Department (HOSPITAL_COMMUNITY): Payer: Medicare Other

## 2022-07-12 ENCOUNTER — Emergency Department (HOSPITAL_COMMUNITY)
Admission: EM | Admit: 2022-07-12 | Discharge: 2022-07-12 | Disposition: A | Discharge status: Skilled Nursing Facility | Payer: Medicare Other | Attending: Emergency Medicine | Admitting: Emergency Medicine

## 2022-07-12 DIAGNOSIS — Z79899 Other long term (current) drug therapy: Secondary | ICD-10-CM | POA: Diagnosis not present

## 2022-07-12 DIAGNOSIS — G301 Alzheimer's disease with late onset: Secondary | ICD-10-CM | POA: Diagnosis not present

## 2022-07-12 DIAGNOSIS — F0280 Dementia in other diseases classified elsewhere without behavioral disturbance: Secondary | ICD-10-CM | POA: Diagnosis not present

## 2022-07-12 DIAGNOSIS — Z7902 Long term (current) use of antithrombotics/antiplatelets: Secondary | ICD-10-CM | POA: Insufficient documentation

## 2022-07-12 DIAGNOSIS — N390 Urinary tract infection, site not specified: Secondary | ICD-10-CM | POA: Diagnosis not present

## 2022-07-12 DIAGNOSIS — I10 Essential (primary) hypertension: Secondary | ICD-10-CM | POA: Diagnosis not present

## 2022-07-12 DIAGNOSIS — R4182 Altered mental status, unspecified: Secondary | ICD-10-CM | POA: Diagnosis present

## 2022-07-12 LAB — COMPREHENSIVE METABOLIC PANEL
ALT: 14 U/L (ref 0–44)
AST: 25 U/L (ref 15–41)
Albumin: 2.2 g/dL — ABNORMAL LOW (ref 3.5–5.0)
Alkaline Phosphatase: 45 U/L (ref 38–126)
Anion gap: 7 (ref 5–15)
BUN: 19 mg/dL (ref 8–23)
CO2: 27 mmol/L (ref 22–32)
Calcium: 7.9 mg/dL — ABNORMAL LOW (ref 8.9–10.3)
Chloride: 108 mmol/L (ref 98–111)
Creatinine, Ser: 1 mg/dL (ref 0.44–1.00)
GFR, Estimated: 56 mL/min — ABNORMAL LOW (ref >60)
Glucose, Bld: 89 mg/dL (ref 70–99)
Potassium: 4.2 mmol/L (ref 3.5–5.1)
Sodium: 142 mmol/L (ref 135–145)
Total Bilirubin: 0.4 mg/dL (ref 0.3–1.2)
Total Protein: 4.8 g/dL — ABNORMAL LOW (ref 6.5–8.1)

## 2022-07-12 LAB — URINALYSIS, ROUTINE W REFLEX MICROSCOPIC
Bilirubin Urine: NEGATIVE
Glucose, UA: NEGATIVE mg/dL
Ketones, ur: 5 mg/dL — AB
Nitrite: NEGATIVE
Protein, ur: >=300 mg/dL — AB
Specific Gravity, Urine: 1.016 (ref 1.005–1.030)
WBC, UA: >50 WBC/hpf — ABNORMAL HIGH (ref 0–5)
pH: 7 (ref 5.0–8.0)

## 2022-07-12 LAB — CBC WITH DIFFERENTIAL/PLATELET
Abs Immature Granulocytes: 0.03 10*3/uL (ref 0.00–0.07)
Basophils Absolute: 0.1 10*3/uL (ref 0.0–0.1)
Basophils Relative: 1 %
Eosinophils Absolute: 0.2 10*3/uL (ref 0.0–0.5)
Eosinophils Relative: 4 %
HCT: 43.5 % (ref 36.0–46.0)
Hemoglobin: 13.7 g/dL (ref 12.0–15.0)
Immature Granulocytes: 1 %
Lymphocytes Relative: 39 %
Lymphs Abs: 2.4 10*3/uL (ref 0.7–4.0)
MCH: 30.2 pg (ref 26.0–34.0)
MCHC: 31.5 g/dL (ref 30.0–36.0)
MCV: 96 fL (ref 80.0–100.0)
Monocytes Absolute: 0.6 10*3/uL (ref 0.1–1.0)
Monocytes Relative: 10 %
Neutro Abs: 2.8 10*3/uL (ref 1.7–7.7)
Neutrophils Relative %: 45 %
Platelets: 137 10*3/uL — ABNORMAL LOW (ref 150–400)
RBC: 4.53 MIL/uL (ref 3.87–5.11)
RDW: 16.2 % — ABNORMAL HIGH (ref 11.5–15.5)
WBC: 6.1 10*3/uL (ref 4.0–10.5)
nRBC: 0 % (ref 0.0–0.2)

## 2022-07-12 MED ORDER — SODIUM CHLORIDE 0.9 % IV SOLN
1.0000 g | Freq: Once | INTRAVENOUS | Status: AC
  Administered 2022-07-12: 1 g via INTRAVENOUS
  Filled 2022-07-12: qty 10

## 2022-07-12 MED ORDER — SODIUM CHLORIDE 0.9 % IV BOLUS
1000.0000 mL | Freq: Once | INTRAVENOUS | Status: AC
  Administered 2022-07-12: 1000 mL via INTRAVENOUS

## 2022-07-12 MED ORDER — CEFDINIR 300 MG PO CAPS
300.0000 mg | ORAL_CAPSULE | Freq: Two times a day (BID) | ORAL | 0 refills | Status: AC
  Filled 2022-07-12: qty 14, 7d supply, fill #0

## 2022-07-12 NOTE — ED Provider Notes (Signed)
  Physical Exam  BP (!) 144/55   Pulse 60   Temp 98 F (36.7 C) (Oral)   Resp 12   SpO2 100%   Physical Exam  Procedures  Procedures  ED Course / MDM   Clinical Course as of 07/12/22 2204  Fri Jul 12, 2022  1322 DG Chest 1 View [WS]  [phone] Signed out to the oncoming provider pending CMP.  Patient's workup notable for signs of urine infection.  Will give IV fluids and 1 g ceftriaxone IV.  Patient's son would prefer that patient goes home as she is hospice and be treated at her nursing facility.  Discussed pending CMP.  Will wait for CMP as significant abnormalities may change management [WS]    Clinical Course User Index [WS] Cristie Hem, MD   Medical Decision Making Amount and/or Complexity of Data Reviewed Labs: ordered. Radiology: ordered. Decision-making details documented in ED Course.  Risk Prescription drug management.   Received care of patient from previous provider. Please see their note for prior hx, physical and care.  CMP returned without clinically significant abnormalities, both hypoalbuminemia and associated hypocalcemia.    Received rocephin.  Will dc with abx for UTI.        Gareth Morgan, MD 07/13/22 1347

## 2022-07-12 NOTE — ED Provider Notes (Signed)
[facility] EMERGENCY DEPARTMENT Provider Note  CSN: [id number] Arrival date & time: 07/12/22 1112  Chief Complaint(s) Fall  HPI Sara Perkins is a 86 y.o. female with history of Alzheimer's disease on hospice presenting to the emergency department with altered mental status.  Patient having around 5 days of increasing fatigue and lethargy.  Not eating as much.  Patient's son reports that she fell around 5 days ago, did not initially bring her to the hospital.  He was worried that she might have intracranial bleeding so he brought her in today.  Patient reports feeling tired.  History limited Alzheimer's disease and chronic dementia   Past Medical History Past Medical History:  Diagnosis Date   Alzheimer disease ([place])    Broken foot 2017   Chicken pox    Syncope    Related to BP medication    TIA (transient ischemic attack)    UTI (urinary tract infection)    Patient Active Problem List   Diagnosis Date Noted   Closed fracture of multiple pubic rami, right, initial encounter ([place]) 11/25/2021   Alzheimer's disease with late onset ([place]) 11/02/2021   Chronic pain 11/02/2021   Essential hypertension 11/02/2021   Hypercholesterolemia 11/02/2021   Primary insomnia 11/02/2021   NSTEMI (non-ST elevated myocardial infarction) ([place]) 05/22/2021   Frequent falls 05/22/2021   Pacemaker 04/27/2018   Memory change 01/28/2018   Adjustment disorder with depressed mood 01/28/2018   History of TIA (transient ischemic attack) 01/28/2018   Home Medication(s) Prior to Admission medications   Medication Sig Start Date End Date Taking? Authorizing Provider  cefdinir (OMNICEF) 300 MG capsule Take 1 capsule (300 mg total) by mouth 2 (two) times daily for 7 days. 07/12/22 07/19/22 Yes Cristie Hem, MD  acetaminophen (TYLENOL) 500 MG tablet Take 1 tablet (500 mg total) by mouth in the morning, at noon, and at bedtime. 12/05/21   Reed, Tiffany L, DO  alendronate (FOSAMAX) 70 MG  tablet Take 70 mg by mouth every Friday. 04/17/21   [provider]  atorvastatin (LIPITOR) 10 MG tablet TAKE 1 TABLET BY MOUTH  DAILY Patient taking differently: Take 10 mg by mouth daily. 05/25/19   Luetta Nutting, DO  calcium-vitamin D (OSCAL WITH D) 500-200 MG-UNIT tablet Take 1 tablet by mouth every evening. With dinner    [provider]  Cholecalciferol (VITAMIN D) 125 MCG (5000 UT) CAPS Take 1 capsule by mouth daily. Patient taking differently: Take 5,000 Units by mouth daily. 05/24/21   Little Ishikawa, MD  clopidogrel (PLAVIX) 75 MG tablet TAKE 1 TABLET BY MOUTH  DAILY Patient taking differently: Take 75 mg by mouth daily. 02/04/19   Luetta Nutting, DO  divalproex (DEPAKOTE SPRINKLE) 125 MG capsule Take 125 mg by mouth 3 (three) times daily. 05/22/21   [provider]  donepezil (ARICEPT) 10 MG tablet Take 10 mg by mouth daily. 05/22/21   [provider]  escitalopram (LEXAPRO) 10 MG tablet Take 10 mg by mouth daily.    [provider]  HYDROcodone-acetaminophen (NORCO) 5-325 MG tablet Take 1 tablet by mouth every 4 (four) hours as needed (pain). 11/29/21 11/29/22  Pokhrel, Corrie Mckusick, MD  melatonin 5 MG TABS Take 5 mg by mouth at bedtime.    [provider]  memantine (NAMENDA) 10 MG tablet Take 10 mg by mouth 2 (two) times daily. 05/22/21   [provider]  Multiple Vitamins-Minerals (CENTRUM ADULTS) TABS Take 1 tablet by mouth daily.    [provider]  potassium chloride SA (K-DUR,KLOR-CON) 20 MEQ tablet Take 1 tablet (20 mEq total) by mouth daily. 11/23/18   Luetta Nutting, DO  senna-docusate (SENOKOT-S) 8.6-50 MG tablet Take 1 tablet by mouth at bedtime as needed for mild constipation or moderate constipation. 11/29/21   Pokhrel, Corrie Mckusick, MD  vitamin B-12 (CYANOCOBALAMIN) 1000 MCG tablet Take 1,000 mcg by mouth in the morning and at bedtime.    [provider]                                                                                                                                     Past Surgical History Past Surgical History:  Procedure Laterality Date   ABDOMINAL HYSTERECTOMY     APPENDECTOMY     PACEMAKER GENERATOR CHANGE  11/25/2016   [employer] L [employer] generator change performed in [facility] in [place]  12/24/2008   MDT PPM implanted by Dr Joeseph Amor in [place]    Family History Family History  Problem Relation Age of Onset   Kidney disease Father     Social History Social History   Tobacco Use   Smoking status: Never    Passive exposure: Never   Smokeless tobacco: Never  Vaping Use   Vaping Use: Never used  Substance Use Topics   Alcohol use: Never   Drug use: Never   Allergies Patient has no known allergies.  Review of Systems Review of Systems  Unable to perform ROS: Dementia    Physical Exam Vital Signs  I have reviewed the triage vital signs BP (!) 114/49   Pulse 68   Temp 98 F (36.7 C)   Resp 16   SpO2 96%  Physical Exam Vitals and nursing note reviewed.  Constitutional:      General: She is not in acute distress.    Appearance: She is well-developed.  HENT:     Head: Normocephalic and atraumatic.     Mouth/Throat:     Mouth: Mucous membranes are dry.  Eyes:     Pupils: Pupils are equal, round, and reactive to light.  Cardiovascular:     Rate and Rhythm: Normal rate and regular rhythm.     Heart sounds: No murmur heard. Pulmonary:     Effort: Pulmonary effort is normal. No respiratory distress.     Breath sounds: Normal breath sounds.  Abdominal:     General: Abdomen is flat.     Palpations: Abdomen is soft.     Tenderness: There is no abdominal tenderness.  Musculoskeletal:        General: No tenderness.     Right lower leg: No edema.     Left lower leg: No edema.  Skin:    General: Skin is warm and dry.  Neurological:     Mental Status: She is alert.     Comments: Sleeping, arousable to voice,  oriented to self, place not situation  or time  Psychiatric:        Mood and Affect: Mood normal.        Behavior: Behavior normal.     ED Results and Treatments Labs (all labs ordered are listed, but only abnormal results are displayed) Labs Reviewed  CBC WITH DIFFERENTIAL/PLATELET - Abnormal; Notable for the following components:      Result Value   RDW 16.2 (*)    Platelets 137 (*)    All other components within normal limits  URINALYSIS, ROUTINE W REFLEX MICROSCOPIC - Abnormal; Notable for the following components:   APPearance TURBID (*)    Hgb urine dipstick SMALL (*)    Ketones, ur 5 (*)    Protein, ur >=300 (*)    Leukocytes,Ua MODERATE (*)    WBC, UA >50 (*)    Bacteria, UA MANY (*)    All other components within normal limits  URINE CULTURE  COMPREHENSIVE METABOLIC PANEL                                                                                                                          Radiology CT Cervical Spine Wo Contrast  Result Date: 07/12/2022 CLINICAL DATA:  Trauma, fall EXAM: CT CERVICAL SPINE WITHOUT CONTRAST TECHNIQUE: Multidetector CT imaging of the cervical spine was performed without intravenous contrast. Multiplanar CT image reconstructions were also generated. RADIATION DOSE REDUCTION: This exam was performed according to the departmental dose-optimization program which includes automated exposure control, adjustment of the mA and/or kV according to patient size and/or use of iterative reconstruction technique. COMPARISON:  11/01/2021 FINDINGS: Alignment: There is a minimal 2 mm anterolisthesis at C3-C4 level with no interval change. Skull base and vertebrae: No recent fracture is seen. 2 x 3 mm calcific density posterior to the spinous process of C5 vertebra has not changed suggesting ligament calcification from previous injury. Degenerative changes are noted with bony spurs in launch axial joint. Degenerative changes are noted with disc space narrowing  and bony spurs at multiple levels in cervical spine. Soft tissues and spinal canal: There is extrinsic pressure over the ventral margin of thecal sac caused by posterior bony spurs, more so at C6-C7 level with no interval change. Disc levels: There is encroachment of neural foramina from C2-C7 levels, more prominent from C4-C7 levels. Upper chest: Unremarkable. Other: Scattered arterial calcifications are seen. Pacemaker battery is seen in the left infraclavicular region. IMPRESSION: No recent fracture is seen. Cervical spondylosis with encroachment of neural foramina at multiple levels. Overall, no significant interval changes are noted. Electronically Signed   By: Elmer Picker M.D.   On: 07/12/2022 14:09   CT Head Wo Contrast  Result Date: 07/12/2022 CLINICAL DATA:  Trauma, fall EXAM: CT HEAD WITHOUT CONTRAST TECHNIQUE: Contiguous axial images were obtained from the base of the skull through the vertex without intravenous contrast. RADIATION DOSE REDUCTION: This exam was performed according to the departmental dose-optimization program which includes automated exposure control, adjustment of the mA and/or  kV according to patient size and/or use of iterative reconstruction technique. COMPARISON:  11/25/2021 FINDINGS: Brain: No acute intracranial findings are seen. There are no signs of bleeding within the cranium. Cortical sulci are prominent. There is decreased density in periventricular and subcortical white matter. There is prominence of third and both lateral ventricles. There is no shift of midline structures. There is decreased density in left side of pons and left cerebellum with no significant interval change. Vascular: Scattered arterial calcifications are noted. Skull: No fracture is seen in calvarium. Sinuses/Orbits: Minimal mucosal thickening is seen in ethmoid and sphenoid sinuses. Postsurgical changes are noted in the optic globes. Other: None. IMPRESSION: No acute intracranial findings are  seen. Atrophy. Small-vessel disease. Electronically Signed   By: Elmer Picker M.D.   On: 07/12/2022 14:03   DG Chest 1 View  Result Date: 07/12/2022 CLINICAL DATA:  Cough in adult.  Weakness/fall. EXAM: CHEST  1 VIEW COMPARISON:  PA chest and bilateral rib radiographs 11/01/2021. FINDINGS: Left chest wall cardiac pacer is seen with leads overlying the right atrium and right ventricle, similar to prior. Cardiac silhouette and mediastinal contours are within normal limits. Mild calcification within aortic arch. The lungs are clear. No pleural effusion or pneumothorax. Mild dextrocurvature of the mid to upper thoracic spine with moderate multilevel degenerative disc changes. IMPRESSION: No acute cardiopulmonary process. Electronically Signed   By: Yvonne Kendall M.D.   On: 07/12/2022 13:01    Pertinent labs & imaging results that were available during my care of the patient were reviewed by me and considered in my medical decision making (see MDM for details).  Medications Ordered in ED Medications  sodium chloride 0.9 % bolus 1,000 mL (has no administration in time range)  cefTRIAXone (ROCEPHIN) 1 g in sodium chloride 0.9 % 100 mL IVPB (has no administration in time range)                                                                                                                                     Procedures Procedures  (including critical care time)  Medical Decision Making / ED Course   MDM:  86 year old female presenting to the emergency department fatigue and lethargy.  Patient overall well-appearing, tired, arousable to voice.  Workup notable for signs of urinary infection, dehydration which is likely the cause of the patient's symptoms.  CBC reassuring.  CT head with no evidence of acute intracranial process.  Chest x-ray with no evidence of pneumonia.  No abdominal tenderness to suggest intra-abdominal infection.  Clinical Course as of 07/12/22 1552  Fri Jul 12, 2022   1322 DG Chest 1 View [WS]  [phone] Signed out to the oncoming provider pending CMP.  Patient's workup notable for signs of urine infection.  Will give IV fluids and 1 g ceftriaxone IV.  Patient's son would prefer that patient goes home as she is hospice and be treated at her nursing  facility.  Discussed pending CMP.  Will wait for CMP as significant abnormalities may change management [WS]    Clinical Course User Index [WS] Cristie Hem, MD     Additional history obtained: -Additional history obtained from family and ems -External records from outside source obtained and reviewed including: Chart review including previous notes, labs, imaging, consultation notes including admission 11/25/21   Lab Tests: -I ordered, reviewed, and interpreted labs.   The pertinent results include:   Labs Reviewed  CBC WITH DIFFERENTIAL/PLATELET - Abnormal; Notable for the following components:      Result Value   RDW 16.2 (*)    Platelets 137 (*)    All other components within normal limits  URINALYSIS, ROUTINE W REFLEX MICROSCOPIC - Abnormal; Notable for the following components:   APPearance TURBID (*)    Hgb urine dipstick SMALL (*)    Ketones, ur 5 (*)    Protein, ur >=300 (*)    Leukocytes,Ua MODERATE (*)    WBC, UA >50 (*)    Bacteria, UA MANY (*)    All other components within normal limits  URINE CULTURE  COMPREHENSIVE METABOLIC PANEL    Notable for signs of urinary tract infection   Imaging Studies ordered: I ordered imaging studies including CXR, CT head On my interpretation imaging demonstrates no acute process I independently visualized and interpreted imaging. I agree with the radiologist interpretation   Medicines ordered and prescription drug management: Meds ordered this encounter  Medications   sodium chloride 0.9 % bolus 1,000 mL   cefTRIAXone (ROCEPHIN) 1 g in sodium chloride 0.9 % 100 mL IVPB    Order Specific Question:   Antibiotic Indication:    Answer:   UTI    cefdinir (OMNICEF) 300 MG capsule    Sig: Take 1 capsule (300 mg total) by mouth 2 (two) times daily for 7 days.    Dispense:  14 capsule    Refill:  0    -I have reviewed the patients home medicines and have made adjustments as needed  Social Determinants of Health:  Diagnosis or treatment significantly limited by social determinants of health: dementia   Reevaluation: After the interventions noted above, I reevaluated the patient and found that they have improved  Co morbidities that complicate the patient evaluation  Past Medical History:  Diagnosis Date   Alzheimer disease ([place])    Broken foot 2017   Chicken pox    Syncope    Related to BP medication    TIA (transient ischemic attack)    UTI (urinary tract infection)       Dispostion: Disposition decision including need for hospitalization was considered, and patient disposition pending at time of sign out    Final Clinical Impression(s) / ED Diagnoses Final diagnoses:  Urinary tract infection without hematuria, site unspecified     This chart was dictated using voice recognition software.  Despite best efforts to proofread,  errors can occur which can change the documentation meaning.    Cristie Hem, MD 07/12/22 [phone]

## 2022-07-12 NOTE — ED Triage Notes (Signed)
Pt bib ems [facility]; fall 5 days ago, pt on Plavix; since fall, pt lethargic; normally talkative; POA refused transport to hospital on day of fall; hematoma to back of head; answers some questions, pupils pinpoint, sluggish; hx alzheimer's, TIA; 104/60, P 67, 95% RA, cbg 124; RR 14

## 2022-07-12 NOTE — ED Notes (Signed)
RN unable to obtain IV access at this time. Assistance from second RN requested.

## 2022-07-13 LAB — URINE CULTURE

## 2022-08-27 ENCOUNTER — Ambulatory Visit: Payer: Medicare Other | Attending: Cardiovascular Disease

## 2022-08-27 DIAGNOSIS — I495 Sick sinus syndrome: Secondary | ICD-10-CM

## 2022-08-27 LAB — CUP PACEART REMOTE DEVICE CHECK
Battery Remaining Longevity: 36 mo
Battery Remaining Percentage: 61 %
Brady Statistic RA Percent Paced: 25 %
Brady Statistic RV Percent Paced: 0 %
Date Time Interrogation Session: 20240116023000
Implantable Lead Connection Status: 753985
Implantable Lead Connection Status: 753985
Implantable Lead Implant Date: 20100415
Implantable Lead Implant Date: 20100415
Implantable Lead Location: 753859
Implantable Lead Location: 753860
Implantable Lead Model: 5076
Implantable Lead Model: 5076
Implantable Pulse Generator Implant Date: 20180416
Lead Channel Impedance Value: 527 Ohm
Lead Channel Impedance Value: 544 Ohm
Lead Channel Pacing Threshold Amplitude: 0.4 V
Lead Channel Pacing Threshold Amplitude: 1.1 V
Lead Channel Pacing Threshold Pulse Width: 0.4 ms
Lead Channel Pacing Threshold Pulse Width: 0.4 ms
Lead Channel Setting Pacing Amplitude: 2 V
Lead Channel Setting Pacing Amplitude: 2.5 V
Lead Channel Setting Pacing Pulse Width: 0.4 ms
Lead Channel Setting Sensing Sensitivity: 2.5 mV
Pulse Gen Serial Number: 765776
Zone Setting Status: 755011

## 2022-09-20 NOTE — Progress Notes (Signed)
Remote pacemaker transmission.   

## 2022-11-26 ENCOUNTER — Ambulatory Visit (INDEPENDENT_AMBULATORY_CARE_PROVIDER_SITE_OTHER)

## 2022-11-26 DIAGNOSIS — I495 Sick sinus syndrome: Secondary | ICD-10-CM | POA: Diagnosis not present

## 2022-11-27 LAB — CUP PACEART REMOTE DEVICE CHECK
Battery Remaining Longevity: 30 mo
Battery Remaining Percentage: 53 %
Brady Statistic RA Percent Paced: 25 %
Brady Statistic RV Percent Paced: 0 %
Date Time Interrogation Session: 20240416023100
Implantable Lead Connection Status: 753985
Implantable Lead Connection Status: 753985
Implantable Lead Implant Date: 20100415
Implantable Lead Implant Date: 20100415
Implantable Lead Location: 753859
Implantable Lead Location: 753860
Implantable Lead Model: 5076
Implantable Lead Model: 5076
Implantable Pulse Generator Implant Date: 20180416
Lead Channel Impedance Value: 496 Ohm
Lead Channel Impedance Value: 499 Ohm
Lead Channel Pacing Threshold Amplitude: 0.4 V
Lead Channel Pacing Threshold Amplitude: 1 V
Lead Channel Pacing Threshold Pulse Width: 0.4 ms
Lead Channel Pacing Threshold Pulse Width: 0.4 ms
Lead Channel Setting Pacing Amplitude: 2 V
Lead Channel Setting Pacing Amplitude: 2.5 V
Lead Channel Setting Pacing Pulse Width: 0.4 ms
Lead Channel Setting Sensing Sensitivity: 2.5 mV
Pulse Gen Serial Number: 765776
Zone Setting Status: 755011

## 2022-12-30 NOTE — Progress Notes (Signed)
Remote pacemaker transmission.   

## 2023-02-25 ENCOUNTER — Ambulatory Visit: Payer: Medicare Other

## 2023-05-27 ENCOUNTER — Telehealth: Payer: Self-pay | Admitting: Internal Medicine

## 2023-05-27 ENCOUNTER — Ambulatory Visit: Payer: Medicare Other

## 2023-05-27 NOTE — Telephone Encounter (Signed)
LMOVM for patient son to give the device clinic a call back.

## 2023-05-27 NOTE — Telephone Encounter (Signed)
Pt's son called stating that pt passed away in 07-Mar-2023 and is still continuing to get calls regarding Remote Checks. Please advise

## 2023-05-30 NOTE — Telephone Encounter (Signed)
I canceled all upcoming remotes. I marked the patient deceased in [facility] and took the patient out of [facility].

## 2023-08-26 ENCOUNTER — Ambulatory Visit: Payer: Medicare Other

## 2023-11-25 ENCOUNTER — Ambulatory Visit: Payer: Medicare Other
